# Patient Record
Sex: Female | Born: 1980 | Race: Asian | Hispanic: No | Marital: Married | Smoking: Never smoker
Health system: Southern US, Community
[De-identification: ages and names within clinical notes are randomized; demographics above are authoritative.]

## PROBLEM LIST (undated history)

## (undated) DIAGNOSIS — Z789 Other specified health status: Secondary | ICD-10-CM

## (undated) DIAGNOSIS — I10 Essential (primary) hypertension: Secondary | ICD-10-CM

## (undated) HISTORY — PX: NO PAST SURGERIES: SHX2092

---

## 2010-11-08 NOTE — L&D Delivery Note (Addendum)
Operative Delivery Note At 7:12 PM a viable female was delivered via .  Presentation: vertex; Position: Right,, Occiput,, Posterior; Station: +2.  Verbal consent: obtained from patient with translator.  Risks and benefits discussed in detail.  Risks include, but are not limited to the risks of anesthesia, bleeding, infection, damage to maternal tissues, fetal cephalhematoma.  There is also the risk of inability to effect vaginal delivery of the head, or shoulder dystocia that cannot be resolved by established maneuvers, leading to the need for emergency cesarean section. Attending: Dr. Marice Potter Fellow: Dr. Orvan Falconer Indications: protracted desdent, maternal exhaustion, fetal asynclitism  Procedure in detail: after informed consent obtained a Kiwi vacuum was placed on the infants head inflated to the green.  After one application it popped off and a midline episiotomy was performed.  The vacuum was reapplied and the infants head was delivered from ROP position.  Nuchal cord x3 with body cord.  Cord clamped and cut and infant placed in warmer with awaiting RN/NICU.  APGAR: 1, 8; weight 6 lb 1.5 oz (2765 g).   Nuchal cord x3 with body cord Placenta status: spontaneous and intact with 3VC Cord: 3 vessels with the following complications: .  Cord pH: pending, attempted to obtain but vessels very small  Anesthesia: Epidural Local  Instruments: kiwi vacuum Episiotomy: midline Lacerations: 2nd degree laceration Suture Repair: 3.0 vicryl Est. Blood Loss (mL): Mom to postpartum.  Baby to nursery-stable.  Valerie Monroe 06/21/2011, 7:51 PM

## 2011-02-24 ENCOUNTER — Other Ambulatory Visit: Payer: Self-pay | Admitting: Family Medicine

## 2011-02-24 DIAGNOSIS — Z3689 Encounter for other specified antenatal screening: Secondary | ICD-10-CM

## 2011-02-24 LAB — ANTIBODY SCREEN: Antibody Screen: NEGATIVE

## 2011-02-24 LAB — RPR: RPR: NONREACTIVE

## 2011-02-24 LAB — ABO/RH

## 2011-03-01 ENCOUNTER — Encounter (HOSPITAL_COMMUNITY): Payer: Self-pay

## 2011-03-01 ENCOUNTER — Ambulatory Visit (HOSPITAL_COMMUNITY)
Admission: RE | Admit: 2011-03-01 | Discharge: 2011-03-01 | Disposition: A | Discharge status: Home / Self Care | Payer: Medicaid Other | Source: Ambulatory Visit | Attending: Family Medicine | Admitting: Family Medicine

## 2011-03-01 DIAGNOSIS — Z3689 Encounter for other specified antenatal screening: Secondary | ICD-10-CM

## 2011-03-01 DIAGNOSIS — Z1389 Encounter for screening for other disorder: Secondary | ICD-10-CM | POA: Insufficient documentation

## 2011-03-01 DIAGNOSIS — Z363 Encounter for antenatal screening for malformations: Secondary | ICD-10-CM | POA: Insufficient documentation

## 2011-03-01 DIAGNOSIS — O358XX0 Maternal care for other (suspected) fetal abnormality and damage, not applicable or unspecified: Secondary | ICD-10-CM | POA: Insufficient documentation

## 2011-04-09 ENCOUNTER — Inpatient Hospital Stay (HOSPITAL_COMMUNITY)
Admission: AD | Admit: 2011-04-09 | Payer: Medicaid Other | Source: Ambulatory Visit | Admitting: Obstetrics & Gynecology

## 2011-06-20 ENCOUNTER — Inpatient Hospital Stay (HOSPITAL_COMMUNITY)
Admission: AD | Admit: 2011-06-20 | Discharge: 2011-06-23 | DRG: 774 | Disposition: A | Discharge status: Home / Self Care | Payer: Medicaid Other | Source: Ambulatory Visit | Attending: Obstetrics & Gynecology | Admitting: Obstetrics & Gynecology

## 2011-06-20 DIAGNOSIS — IMO0002 Reserved for concepts with insufficient information to code with codable children: Principal | ICD-10-CM | POA: Diagnosis present

## 2011-06-20 DIAGNOSIS — O139 Gestational [pregnancy-induced] hypertension without significant proteinuria, unspecified trimester: Secondary | ICD-10-CM

## 2011-06-20 DIAGNOSIS — O480 Post-term pregnancy: Secondary | ICD-10-CM | POA: Diagnosis present

## 2011-06-20 DIAGNOSIS — O324XX0 Maternal care for high head at term, not applicable or unspecified: Secondary | ICD-10-CM | POA: Diagnosis present

## 2011-06-20 DIAGNOSIS — Z3480 Encounter for supervision of other normal pregnancy, unspecified trimester: Secondary | ICD-10-CM

## 2011-06-20 HISTORY — DX: Other specified health status: Z78.9

## 2011-06-21 ENCOUNTER — Encounter (HOSPITAL_COMMUNITY): Payer: Self-pay | Admitting: *Deleted

## 2011-06-21 ENCOUNTER — Inpatient Hospital Stay (HOSPITAL_COMMUNITY): Payer: Medicaid Other | Admitting: Anesthesiology

## 2011-06-21 ENCOUNTER — Other Ambulatory Visit: Payer: Self-pay | Admitting: Obstetrics & Gynecology

## 2011-06-21 ENCOUNTER — Encounter (HOSPITAL_COMMUNITY): Payer: Self-pay | Admitting: Anesthesiology

## 2011-06-21 DIAGNOSIS — O324XX0 Maternal care for high head at term, not applicable or unspecified: Secondary | ICD-10-CM

## 2011-06-21 DIAGNOSIS — IMO0002 Reserved for concepts with insufficient information to code with codable children: Secondary | ICD-10-CM

## 2011-06-21 LAB — CBC
HCT: 39.9 % (ref 36.0–46.0)
MCH: 31.5 pg (ref 26.0–34.0)
MCHC: 33.8 g/dL (ref 30.0–36.0)
MCV: 93.2 fL (ref 78.0–100.0)
Platelets: 196 10*3/uL (ref 150–400)
Platelets: 192 10*3/uL (ref 150–400)
RBC: 4.65 MIL/uL (ref 3.87–5.11)
RDW: 13.2 % (ref 11.5–15.5)
WBC: 19.6 10*3/uL — ABNORMAL HIGH (ref 4.0–10.5)

## 2011-06-21 LAB — COMPREHENSIVE METABOLIC PANEL
Albumin: 2.6 g/dL — ABNORMAL LOW (ref 3.5–5.2)
Alkaline Phosphatase: 232 U/L — ABNORMAL HIGH (ref 39–117)
BUN: 13 mg/dL (ref 6–23)
Calcium: 8.7 mg/dL (ref 8.4–10.5)
Creatinine, Ser: 0.72 mg/dL (ref 0.50–1.10)
GFR calc Af Amer: >60 mL/min (ref >60)
Glucose, Bld: 92 mg/dL (ref 70–99)
Potassium: 4.1 mEq/L (ref 3.5–5.1)
Total Protein: 6.1 g/dL (ref 6.0–8.3)

## 2011-06-21 LAB — PROTEIN / CREATININE RATIO, URINE
Protein Creatinine Ratio: 0.48 — ABNORMAL HIGH (ref 0.00–0.15)
Total Protein, Urine: 13.4 mg/dL

## 2011-06-21 MED ORDER — PHENYLEPHRINE 40 MCG/ML (10ML) SYRINGE FOR IV PUSH (FOR BLOOD PRESSURE SUPPORT)
80.0000 ug | PREFILLED_SYRINGE | INTRAVENOUS | Status: DC | PRN
  Filled 2011-06-21 (×2): qty 5

## 2011-06-21 MED ORDER — MAGNESIUM SULFATE 40 G IN LACTATED RINGERS - SIMPLE
2.0000 g/h | INTRAVENOUS | Status: DC
  Filled 2011-06-21: qty 500

## 2011-06-21 MED ORDER — BENZOCAINE-MENTHOL 20-0.5 % EX AERO
1.0000 "application " | INHALATION_SPRAY | CUTANEOUS | Status: DC | PRN
  Administered 2011-06-22: 1 via TOPICAL
  Filled 2011-06-21: qty 56

## 2011-06-21 MED ORDER — LIDOCAINE HCL 1.5 % IJ SOLN
INTRAMUSCULAR | Status: DC | PRN
  Administered 2011-06-21 (×2): 5 mL via EPIDURAL

## 2011-06-21 MED ORDER — MISOPROSTOL 200 MCG PO TABS
ORAL_TABLET | ORAL | Status: AC
  Administered 2011-06-21: 1000 ug via RECTAL
  Filled 2011-06-21: qty 5

## 2011-06-21 MED ORDER — LIDOCAINE HCL (PF) 1 % IJ SOLN
30.0000 mL | INTRAMUSCULAR | Status: DC | PRN
  Filled 2011-06-21 (×2): qty 30

## 2011-06-21 MED ORDER — FENTANYL 2.5 MCG/ML BUPIVACAINE 1/10 % EPIDURAL INFUSION (WH - ANES)
INTRAMUSCULAR | Status: DC | PRN
  Administered 2011-06-21: 14 mL/h via EPIDURAL

## 2011-06-21 MED ORDER — WITCH HAZEL-GLYCERIN EX PADS: 1.0000 "application " | MEDICATED_PAD | CUTANEOUS | Status: DC | PRN

## 2011-06-21 MED ORDER — FERROUS SULFATE 325 (65 FE) MG PO TABS
325.0000 mg | ORAL_TABLET | Freq: Two times a day (BID) | ORAL | Status: DC
  Administered 2011-06-22 – 2011-06-23 (×4): 325 mg via ORAL
  Filled 2011-06-21 (×3): qty 1

## 2011-06-21 MED ORDER — OXYTOCIN BOLUS FROM INFUSION
500.0000 mL | Freq: Once | INTRAVENOUS | Status: DC
  Filled 2011-06-21: qty 500
  Filled 2011-06-21: qty 1000

## 2011-06-21 MED ORDER — SIMETHICONE 80 MG PO CHEW: 80.0000 mg | CHEWABLE_TABLET | ORAL | Status: DC | PRN

## 2011-06-21 MED ORDER — DIPHENHYDRAMINE HCL 50 MG/ML IJ SOLN: 12.5000 mg | INTRAMUSCULAR | Status: DC | PRN

## 2011-06-21 MED ORDER — IBUPROFEN 600 MG PO TABS
600.0000 mg | ORAL_TABLET | Freq: Four times a day (QID) | ORAL | Status: DC
  Administered 2011-06-22 – 2011-06-23 (×8): 600 mg via ORAL
  Filled 2011-06-21 (×8): qty 1

## 2011-06-21 MED ORDER — SENNOSIDES-DOCUSATE SODIUM 8.6-50 MG PO TABS
2.0000 | ORAL_TABLET | Freq: Every day | ORAL | Status: DC
  Administered 2011-06-22 (×2): 2 via ORAL

## 2011-06-21 MED ORDER — MAGNESIUM SULFATE 40 G IN LACTATED RINGERS - SIMPLE
2.0000 g/h | INTRAVENOUS | Status: AC
  Administered 2011-06-22: 2 g/h via INTRAVENOUS
  Filled 2011-06-21: qty 500

## 2011-06-21 MED ORDER — OXYTOCIN 20 UNITS IN LACTATED RINGERS INFUSION - SIMPLE: 125.0000 mL/h | INTRAVENOUS | Status: DC

## 2011-06-21 MED ORDER — OXYTOCIN 20 UNITS IN LACTATED RINGERS INFUSION - SIMPLE
1.0000 m[IU]/min | INTRAVENOUS | Status: DC
  Administered 2011-06-21: 12 m[IU]/min via INTRAVENOUS
  Administered 2011-06-21: 33.333 m[IU]/min via INTRAVENOUS
  Administered 2011-06-21: 2 m[IU]/min via INTRAVENOUS
  Administered 2011-06-21: 333 m[IU]/min via INTRAVENOUS
  Filled 2011-06-21: qty 1000

## 2011-06-21 MED ORDER — MAGNESIUM SULFATE BOLUS VIA INFUSION
4.0000 g | Freq: Once | INTRAVENOUS | Status: DC
  Filled 2011-06-21: qty 500

## 2011-06-21 MED ORDER — IBUPROFEN 600 MG PO TABS: 600.0000 mg | ORAL_TABLET | Freq: Four times a day (QID) | ORAL | Status: DC | PRN

## 2011-06-21 MED ORDER — OXYCODONE-ACETAMINOPHEN 5-325 MG PO TABS: 2.0000 | ORAL_TABLET | ORAL | Status: DC | PRN

## 2011-06-21 MED ORDER — LACTATED RINGERS IV SOLN
INTRAVENOUS | Status: DC
  Administered 2011-06-21: 25 mL/h via INTRAVENOUS

## 2011-06-21 MED ORDER — TERBUTALINE SULFATE 1 MG/ML IJ SOLN: 0.2500 mg | Freq: Once | INTRAMUSCULAR | Status: DC | PRN

## 2011-06-21 MED ORDER — LANOLIN HYDROUS EX OINT: TOPICAL_OINTMENT | CUTANEOUS | Status: DC | PRN

## 2011-06-21 MED ORDER — ONDANSETRON HCL 4 MG/2ML IJ SOLN: 4.0000 mg | INTRAMUSCULAR | Status: DC | PRN

## 2011-06-21 MED ORDER — DIPHENHYDRAMINE HCL 25 MG PO CAPS: 25.0000 mg | ORAL_CAPSULE | Freq: Four times a day (QID) | ORAL | Status: DC | PRN

## 2011-06-21 MED ORDER — LACTATED RINGERS IV SOLN
INTRAVENOUS | Status: DC
  Administered 2011-06-22 (×2): via INTRAVENOUS

## 2011-06-21 MED ORDER — FENTANYL 2.5 MCG/ML BUPIVACAINE 1/10 % EPIDURAL INFUSION (WH - ANES)
14.0000 mL/h | INTRAMUSCULAR | Status: DC
  Administered 2011-06-21 (×2): 14 mL/h via EPIDURAL
  Filled 2011-06-21 (×3): qty 60

## 2011-06-21 MED ORDER — MAGNESIUM SULFATE BOLUS VIA INFUSION
4.0000 g | Freq: Once | INTRAVENOUS | Status: AC
  Administered 2011-06-21: 4 g via INTRAVENOUS

## 2011-06-21 MED ORDER — FLEET ENEMA 7-19 GM/118ML RE ENEM: 1.0000 | ENEMA | RECTAL | Status: DC | PRN

## 2011-06-21 MED ORDER — LACTATED RINGERS IV SOLN: 500.0000 mL | INTRAVENOUS | Status: DC | PRN

## 2011-06-21 MED ORDER — ONDANSETRON HCL 4 MG PO TABS: 4.0000 mg | ORAL_TABLET | ORAL | Status: DC | PRN

## 2011-06-21 MED ORDER — ONDANSETRON HCL 4 MG/2ML IJ SOLN: 4.0000 mg | Freq: Four times a day (QID) | INTRAMUSCULAR | Status: DC | PRN

## 2011-06-21 MED ORDER — MAGNESIUM SULFATE 40 G IN LACTATED RINGERS - SIMPLE: 2.0000 g/h | INTRAVENOUS | Status: DC

## 2011-06-21 MED ORDER — EPHEDRINE 5 MG/ML INJ
10.0000 mg | INTRAVENOUS | Status: DC | PRN
  Filled 2011-06-21 (×2): qty 4

## 2011-06-21 MED ORDER — TETANUS-DIPHTH-ACELL PERTUSSIS 5-2.5-18.5 LF-MCG/0.5 IM SUSP
0.5000 mL | Freq: Once | INTRAMUSCULAR | Status: AC
  Administered 2011-06-22: 0.5 mL via INTRAMUSCULAR
  Filled 2011-06-21: qty 0.5

## 2011-06-21 MED ORDER — MAGNESIUM SULFATE BOLUS VIA INFUSION: 4.0000 g | Freq: Once | INTRAVENOUS | Status: DC

## 2011-06-21 MED ORDER — LACTATED RINGERS IV SOLN
500.0000 mL | Freq: Once | INTRAVENOUS | Status: AC
  Administered 2011-06-21: 1000 mL via INTRAVENOUS

## 2011-06-21 MED ORDER — EPHEDRINE 5 MG/ML INJ
10.0000 mg | INTRAVENOUS | Status: DC | PRN
  Filled 2011-06-21: qty 4

## 2011-06-21 MED ORDER — DIBUCAINE 1 % RE OINT: 1.0000 "application " | TOPICAL_OINTMENT | RECTAL | Status: DC | PRN

## 2011-06-21 MED ORDER — PHENYLEPHRINE 40 MCG/ML (10ML) SYRINGE FOR IV PUSH (FOR BLOOD PRESSURE SUPPORT)
80.0000 ug | PREFILLED_SYRINGE | INTRAVENOUS | Status: DC | PRN
  Filled 2011-06-21: qty 5

## 2011-06-21 MED ORDER — MAGNESIUM SULFATE 40 G IN LACTATED RINGERS - SIMPLE
2.0000 g/h | INTRAVENOUS | Status: DC
  Administered 2011-06-21 (×2): 2 g/h via INTRAVENOUS
  Filled 2011-06-21: qty 500

## 2011-06-21 MED ORDER — CITRIC ACID-SODIUM CITRATE 334-500 MG/5ML PO SOLN: 30.0000 mL | ORAL | Status: DC | PRN

## 2011-06-21 MED ORDER — OXYCODONE-ACETAMINOPHEN 5-325 MG PO TABS: 1.0000 | ORAL_TABLET | ORAL | Status: DC | PRN

## 2011-06-21 MED ORDER — DEXTROSE 5 % IV SOLN
4.0000 g | Freq: Once | INTRAVENOUS | Status: DC
  Filled 2011-06-21: qty 8

## 2011-06-21 MED ORDER — PRENATAL PLUS 27-1 MG PO TABS
1.0000 | ORAL_TABLET | Freq: Every day | ORAL | Status: DC
  Administered 2011-06-22 – 2011-06-23 (×2): 1 via ORAL
  Filled 2011-06-21 (×2): qty 1

## 2011-06-21 MED ORDER — ACETAMINOPHEN 325 MG PO TABS: 650.0000 mg | ORAL_TABLET | ORAL | Status: DC | PRN

## 2011-06-21 NOTE — Progress Notes (Signed)
Naseem Adler is a 30 y.o. G1P0000 at [redacted]w[redacted]d admitted for induction of labor due to Pre-eclamptic toxemia of pregnancy..  Subjective: Pushing for 1 hour  Objective: BP 175/112  Pulse 122  Temp(Src) 98.1 F (36.7 C) (Axillary)  Resp 20  Ht 5' (1.524 m)  Wt 62.596 kg (138 lb)  BMI 26.95 kg/m2  LMP 10/16/2010 I/O last 3 completed shifts: In: 164.6 [I.V.:164.6] Out: 800 [Urine:800] I/O this shift: In: 1165 [P.O.:390; I.V.:775] Out: 2600 [Urine:2600]  FHT:  FHR: 110 bpm, variability: moderate,  accelerations:  Abscent,  decelerations:  Present early UC:   regular, every 2-3 minutes SVE:   Dilation: 10 Effacement (%): 100 Station: +2 Exam by:: dr. Louanne Belton  Labs: Lab Results  Component Value Date   WBC 19.6* 06/21/2011   HGB 14.8 06/21/2011   HCT 43.0 06/21/2011   MCV 92.5 06/21/2011   PLT 192 06/21/2011    Assessment / Plan: IOL s/p foley bulb for postdates and preE  Labor: progressing well.  continue to push.  appears OA by exam but does have some caput Fetal Wellbeing:  Category II Pain Control:  Epidural I/D:  n/a Anticipated MOD:  NSVD  Ramzy Cappelletti 06/21/2011, 5:31 PM

## 2011-06-21 NOTE — Progress Notes (Signed)
Valerie Monroe is a 30 y.o. G1P0 at [redacted]w[redacted]d admitted for augmentation of labor 2/2 possible preE  Subjective: Pt resting comfortably.  Objective: BP 143/93  Pulse 90  Temp(Src) 99.2 F (37.3 C) (Oral)  Resp 20  Ht 5' (1.524 m)  Wt 138 lb (62.596 kg)  BMI 26.95 kg/m2  LMP 10/16/2010   I/O this shift: In: 64.6 [I.V.:64.6] Out: 300 [Urine:300]  FHT:  FHR: 130 bpm, variability: moderate,  accelerations:  Present,  decelerations:  Absent UC:   irregular, every 5-9 minutes SVE:   Dilation: 2 Effacement (%): 50 Station: -2 Exam by:: Dr Maple Hudson rechecked by RN at 0400, unchanged Lungs: ctab Neuro: no clonus, brisk DTRs Extremities: trace edema  Labs: Lab Results  Component Value Date   WBC 15.8* 06/21/2011   HGB 13.5 06/21/2011   HCT 39.9 06/21/2011   MCV 93.2 06/21/2011   PLT 196 06/21/2011    Assessment / Plan: 30yo G1 @ 40+1wks with gHTN, possible PreE  Labor: On pitocin Fetal Wellbeing:  Category I Pain Control:  Labor support without medications I/D:  GBS neg Anticipated MOD:  NSVD Preeclampsia - AST and plt wnl.  Urine protein/creatinine pending, BP more stable, on Mag. No signs/sx of mag toxicity  Lindaann Slough. MD 06/21/2011, 5:11 AM

## 2011-06-21 NOTE — Consult Note (Signed)
Called to maternal room 168 to attend impending assisted vaginal delivery at 40.[redacted] weeks gestation with h/o preeclampsia on magnesium sulfate.  At delivery after an extended period of vacuum suction infant's head was delivered with tight nuchal cord x 3 and body cord x 1.  With the completion of delivery, infant was limp apneic and moribund. Placed under radiant warmer, laryngoscopy prepared but required suction of oropharynx several times to obtain a clear view of the cords. Infant made no response to these events.  Laryngoscopy @ 1 minutes with suction  yielded no fluid and immediately infant was dried and bag/mask ventilation begun with supplemental oxygen.  Infant had a spontaneous cry at 2 minutes by which time the HR, previously ~ 40/min, was then > 100 .  With continued IPPV, tone rapidly improved to involve all four extremities with active flexion and extension .   IPPV stopped at 2 minutes of age and supplemental O2 continued until 3+ minutes of age. There were no dysmorphic features noted on exam. Umbilical cord has little Wharton's Jelly.   Infant allowed to remain for a short time in maternal room.   Judith Blonder MD [facility]

## 2011-06-21 NOTE — Progress Notes (Signed)
Valerie Monroe is a 30 y.o. G1P0000 at [redacted]w[redacted]d   Subjective: Pt resting comfortably s/p epidural  Objective: BP 156/97  Pulse 110  Temp(Src) 98.1 F (36.7 C) (Oral)  Resp 20  Ht 5' (1.524 m)  Wt 138 lb (62.596 kg)  BMI 26.95 kg/m2  LMP 10/16/2010 I/O last 3 completed shifts: In: 164.6 [I.V.:164.6] Out: 800 [Urine:800] I/O this shift: In: 850 [P.O.:150; I.V.:700] Out: 2000 [Urine:2000] Mag at 2g/hr FHT:  FHR: 110-120 bpm, variability: moderate,  accelerations:  Present,  decelerations:  Present early UC:   regular, every 2-3 minutes Pitocin @ 11/min SVE:  Dilation: 10 Dilation Complete Date: 06/21/11 Dilation Complete Time: 1556 Effacement (%): 100 Cervical Position: Anterior Station: +2 Presentation: Vertex Exam by:: dr. Louanne Belton   Labs: Lab Results  Component Value Date   WBC 19.6* 06/21/2011   HGB 14.8 06/21/2011   HCT 43.0 06/21/2011   MCV 92.5 06/21/2011   PLT 192 06/21/2011    Assessment / Plan: Induction of labor due to preeclampsia and postterm,  progressing well on pitocin  Labor: progressing well on pitocin, will begin pushing Fetal Wellbeing:  Category II Pain Control:  Epidural I/D:  n/a Anticipated MOD:  NSVD  Valerie Monroe 06/21/2011, 4:07 PM

## 2011-06-21 NOTE — Anesthesia Procedure Notes (Signed)
Epidural Patient location during procedure: OB Start time: 06/21/2011 10:16 AM End time: 06/21/2011 10:38 AM  Staffing Anesthesiologist: Sandrea Hughs Performed by: anesthesiologist   Preanesthetic Checklist Completed: patient identified, site marked, surgical consent, pre-op evaluation, timeout performed, IV checked, risks and benefits discussed and monitors and equipment checked  Epidural Patient position: sitting Prep: site prepped and draped Patient monitoring: continuous pulse ox and blood pressure Approach: midline Injection technique: LOR air  Needle:  Needle type: Tuohy  Needle gauge: 17 G Needle length: 9 cm Needle insertion depth: 5 cm cm Catheter type: closed end flexible Catheter size: 19 Gauge Catheter at skin depth: 10 cm Test dose: negative and 1.5% lidocaine  Assessment Sensory level: T10 Events: blood not aspirated, injection not painful, no injection resistance, negative IV test and no paresthesia

## 2011-06-21 NOTE — Anesthesia Preprocedure Evaluation (Signed)
Anesthesia Evaluation  Name, MR# and DOB Patient awake  General Assessment Comment  Reviewed: Allergy & Precautions, H&P , Patient's Chart, lab work & pertinent test results  Airway Mallampati: II TM Distance: >3 FB Neck ROM: full    Dental No notable dental hx.    Pulmonary  clear to auscultation  pulmonary exam normalPulmonary Exam Normal breath sounds clear to auscultation none    Cardiovascular hypertension (pre-eclampsia), regular Normal    Neuro/Psych Negative Neurological ROS  Negative Psych ROS  GI/Hepatic/Renal negative GI ROS, negative Liver ROS, and negative Renal ROS (+)       Endo/Other  Negative Endocrine ROS (+)      Abdominal   Musculoskeletal   Hematology negative hematology ROS (+)   Peds  Reproductive/Obstetrics (+) Pregnancy    Anesthesia Other Findings             Anesthesia Physical Anesthesia Plan  ASA: II  Anesthesia Plan: Epidural   Post-op Pain Management:    Induction:   Airway Management Planned:   Additional Equipment:   Intra-op Plan:   Post-operative Plan:   Informed Consent: I have reviewed the patients History and Physical, chart, labs and discussed the procedure including the risks, benefits and alternatives for the proposed anesthesia with the patient or authorized representative who has indicated his/her understanding and acceptance.     Plan Discussed with:   Anesthesia Plan Comments:         Anesthesia Quick Evaluation

## 2011-06-21 NOTE — Progress Notes (Signed)
Pt G1 at 40wks having contractions x 1 day.  Pt denies any problems with pregnancy.

## 2011-06-21 NOTE — Progress Notes (Signed)
Margree Gimbel is a 30 y.o. G1P0 at [redacted]w[redacted]d admitted for induction of labor due to Hypertension.  Subjective: No acute issues.  Objective: BP 148/87  Pulse 90  Temp(Src) 99.2 F (37.3 C) (Oral)  Resp 20  Ht 5' (1.524 m)  Wt 138 lb (62.596 kg)  BMI 26.95 kg/m2  LMP 10/16/2010  plt 196 AST 16    FHT:  FHR: 125 bpm, variability: moderate,  accelerations:  Present,  decelerations:  Absent UC:   irregular,  SVE:   Dilation: 2 Effacement (%): 50 Station: -2 Exam by:: Dr Maple Hudson  Labs: Lab Results  Component Value Date   WBC 15.8* 06/21/2011   HGB 13.5 06/21/2011   HCT 39.9 06/21/2011   MCV 93.2 06/21/2011   PLT 196 06/21/2011    Assessment / Plan: 30yo G1 @ 40+1wks with gHTN  Labor: will start on pit, platelet and LFT wnl, urine protein:creatinine pending, on Mag Fetal Wellbeing:  Category I Pain Control:  Labor support without medications I/D:  GBS neg Anticipated MOD:  NSVD  Lindaann Slough MD 06/21/2011, 3:04 AM

## 2011-06-21 NOTE — Progress Notes (Signed)
Valerie Monroe is a 29 y.o. G1P0000 at [redacted]w[redacted]d   Subjective: Pt resting comfortably s/p epidural  Objective: BP 141/91  Pulse 90  Temp(Src) 97.5 F (36.4 C) (Oral)  Resp 16  Ht 5' (1.524 m)  Wt 62.596 kg (138 lb)  BMI 26.95 kg/m2  LMP 10/16/2010 I/O last 3 completed shifts: In: 164.6 [I.V.:164.6] Out: 800 [Urine:800] I/O this shift: In: 770 [P.O.:120; I.V.:650] Out: 1450 [Urine:1450] Mag at 2g/hr FHT:  FHR: 110-120 bpm, variability: moderate,  accelerations:  Present,  decelerations:  Present early UC:   regular, every 2-3 minutes Pitocin @ 11/min SVE:  6-7/90/0 IUPC placed  Labs: Lab Results  Component Value Date   WBC 19.6* 06/21/2011   HGB 14.8 06/21/2011   HCT 43.0 06/21/2011   MCV 92.5 06/21/2011   PLT 192 06/21/2011    Assessment / Plan: Induction of labor due to preeclampsia and postterm,  progressing well on pitocin  Labor: progressing well on pitocin, titrate as tolerated Fetal Wellbeing:  Category II Pain Control:  Epidural I/D:  n/a Anticipated MOD:  NSVD  Valerie Monroe 06/21/2011, 1:08 PM

## 2011-06-21 NOTE — Progress Notes (Signed)
Started hurting yesterday worse today.  Thinks she is in labor.  G1p0. 40 wks

## 2011-06-21 NOTE — Progress Notes (Signed)
Maryem Shuffler is a 30 y.o. G1P0 at [redacted]w[redacted]d, admitted for augmentation of labor secondary to ?preE.  Subjective: Feeling ctx, wants epidural.  Foley fell out.  AROM with nurse check after foley fell out.  Thick mec.  Objective: BP 155/73  Pulse 97  Temp(Src) 98.6 F (37 C) (Oral)  Resp 20  Ht 5' (1.524 m)  Wt 138 lb (62.596 kg)  BMI 26.95 kg/m2  LMP 10/16/2010  Fetal Heart Rate: 115 Variability: mod Accelerations: present Decelerations: variables  Contractions: 2-83min  SVE:   Dilation: 6.5 Effacement (%): 100 Station: 0 Exam by:: Enis Slipper, rn  Pitocin: 6 mili-units per min Mag: 2gm per hr   Assessment / Plan: 30 y.o. G1P0 at [redacted]w[redacted]d here for augmentation of labor.  Labor: Progressing well, will continue to monitor Preeclampsia:  On Mag, no signs of toxicity Fetal Wellbeing: Category II Pain Control:  Requesting epidural I/D:  NA  Gillis Boardley 06/21/2011, 9:22 AM

## 2011-06-21 NOTE — Progress Notes (Signed)
Pt cervical exam has had minimal change since admission despite starting pitocin, so decision was made to place foley bulb. SVE prior to placement 1-2/80/-2.  Foley bulb placed with 60mL of saline place in balloon under guidance of Zerita Boers CNM.  Pt tolerated procedure well. Will continue pt on pitocin for augmentation.   Delton See MD

## 2011-06-21 NOTE — Progress Notes (Signed)
Valerie Monroe is a 30 y.o. G1P0 at [redacted]w[redacted]d, admitted for augmentation of labor secondary to ?preE.  Subjective: S/p epidural, resting comfortably  Objective: BP 150/97  Pulse 86  Temp(Src) 98.6 F (37 C) (Oral)  Resp 20  Ht 5' (1.524 m)  Wt 138 lb (62.596 kg)  BMI 26.95 kg/m2  LMP 10/16/2010  Fetal Heart Rate: 115 Variability: mod Accelerations: present Decelerations: variables  Contractions: 2-42min  SVE:   Dilation: 6.5 Effacement (%): 90 Station: 0 Exam by:: Dr. Louanne Belton  Pitocin: 6 mili-units per min Mag: 2gm per hr   Assessment / Plan: 30 y.o. G1P0 at [redacted]w[redacted]d here for augmentation of labor.  Labor: Progressing well, will continue to monitor and titrate pit as necessary Preeclampsia:  On Mag, 32 no signs of toxicity Fetal Wellbeing: Category II Pain Control:  Epidural I/D:  NA  Fady Stamps 06/21/2011, 11:18 AM

## 2011-06-21 NOTE — Progress Notes (Signed)
Transferred to room 374 AICU; report to [place], [place]

## 2011-06-21 NOTE — H&P (Signed)
Girtrude Enslin is a 30 y.o. female G1P0 with IUP at [redacted]w[redacted]d presenting for contractions. Pt states she has been having regular, every 7-10 minutes for one day, associated with scant staining vaginal bleeding, intact, with active.  She desires to condoms.  [facility].  She was late to [facility] was dated by a 24wk scan which showed SGA fetus.  Pregnancy was complicated by BMI 17.6, genital warts, +chlamydia with neg TOC, varicella non-immune, LSIL Pap.  She reports that she has had elevated BPs at her recent appointments.  She currently denies any headaches, blurry vision, chest pain, difficulty breathing, abdominal pain, but does not worsening swelling in her feet.    Past Medical History: Past Medical History  Diagnosis Date  . No pertinent past medical history     Past Surgical History: Past Surgical History  Procedure Date  . No past surgeries     Obstetrical History: OB History    Grav Para Term Preterm Abortions TAB SAB Ect Mult Living   1               Gynecological History: LGSIL Pap intrapartum  Social History: History   Social History  . Marital Status: Married    Spouse Name: N/A    Number of Children: N/A  . Years of Education: N/A   Social History Main Topics  . Smoking status: Never Smoker   . Smokeless tobacco: None  . Alcohol Use: No  . Drug Use: No  . Sexually Active:    Other Topics Concern  . None   Social History Narrative  . None    Family History: No family history on file.  Allergies: No Known Allergies  Prescriptions prior to admission  Medication Sig Dispense Refill  . prenatal vitamin w/FE, FA (PRENATAL 1 + 1) 27-1 MG TABS Take 1 tablet by mouth daily.          Review of Systems - Negative except for above in HPI   Blood pressure 151/102, pulse 86, temperature 98.8 F (37.1 C), temperature source Oral, resp. rate 20, height 5' (1.524 m), weight 138 lb (62.596 kg), last menstrual period  10/16/2010. General appearance: alert, cooperative and no distress Head: Normocephalic, without obvious abnormality, atraumatic Lungs: clear to auscultation bilaterally Heart: regular rate and rhythm, S1, S2 normal, no murmur, click, rub or gallop Abdomen: soft, non-tender; bowel sounds normal; no masses,  no organomegaly and gravid Extremities: edema trace pedal Pulses: 2+ and symmetric cephalic Baseline: 140 bpm, Variability: Good {> 6 bpm) and Accelerations: Reactive Frequency: Every 4-6 minutes Dilation: 2 Effacement (%): 50 Station: -2 Exam by:: Dr Maple Hudson   Prenatal labs: ABO, Rh:  B+ Antibody: Negative (04/18 0000) Rubella:  immune RPR: Nonreactive (04/18 0000)  HBsAg: Negative (04/18 0000)  HIV:   neg GBS: Negative (07/17 0000)  1 hr Glucola 74 Genetic screening declined Anatomy US - showing short extremities, BPP 8/8, normal AFI   Assessment: Anvita Hirata is a 30 y.o. G1P0 with an IUP at [redacted]w[redacted]d presenting for  Contractions, elevated BP possible pre-eclampsia  Plan: Will admit patient to L+D and start on IV magnesium.  Will also start pitocin for augmentation.  Will check CBC, CMP, urine Protein:Creatine. Patient plans on going natural, will bottle feed, and will use condoms for contraception.  I have discussed this patient with Zerita Boers, CNM who is in agreement with this plan   Lindaann Slough MD 06/21/2011, 1:05 AM

## 2011-06-21 NOTE — Anesthesia Postprocedure Evaluation (Signed)
Anesthesia Post Note  Patient: Valerie Monroe  Procedure(s) Performed: * No procedures listed *  Anesthesia type: Epidural  Patient location: Mother/Baby  Post pain: Pain level controlled  Post assessment: Post-op Vital signs reviewed  Last Vitals:  Filed Vitals:   06/21/11 2031  BP: 150/93  Pulse: 122  Temp:   Resp: 20    Post vital signs: Reviewed  Level of consciousness: awake  Complications: No apparent anesthesia complications

## 2011-06-22 ENCOUNTER — Encounter (HOSPITAL_COMMUNITY): Payer: Self-pay | Admitting: *Deleted

## 2011-06-22 LAB — CBC
MCH: 31.7 pg (ref 26.0–34.0)
MCV: 91.5 fL (ref 78.0–100.0)
Platelets: 171 10*3/uL (ref 150–400)
RDW: 13.4 % (ref 11.5–15.5)

## 2011-06-22 MED ORDER — SODIUM CHLORIDE 0.9 % IJ SOLN
3.0000 mL | Freq: Two times a day (BID) | INTRAMUSCULAR | Status: DC
  Administered 2011-06-22 – 2011-06-23 (×2): 3 mL via INTRAVENOUS

## 2011-06-22 NOTE — Addendum Note (Signed)
Addendum  created 06/22/11 0826 by Cephus Shelling   Modules edited:Charges VN, Notes Section

## 2011-06-22 NOTE — Progress Notes (Signed)
Post Partum Day 1 Subjective: no complaints and has good UOP but has not voided since foley taken out this AM.  Objective: Blood pressure 123/84, pulse 90, temperature 98.2 F (36.8 C), temperature source Oral, resp. rate 16, height 5' (1.524 m), weight 129 lb 4.8 oz (58.65 kg), last menstrual period 10/16/2010, SpO2 99.00%, unknown if currently breastfeeding.  Physical Exam:  General: alert, cooperative and no distress CV: RRR Resp: CTABL Abd: decreased BS, uterus at level of umbilicus Lochia: appropriate Uterine Fundus: firm Incision: no incision DVT Evaluation: No evidence of DVT seen on physical exam. Ext: trace b/l edema, 2+ pulses   Basename 06/22/11 0505 06/21/11 0930  HGB 9.3* 14.8  HCT 26.8* 43.0    Assessment/Plan: PPD1 s/p vavd now on mag secondary to pre-e.  Doing well currently with well controlled bp.  Diuresing well.  Will cont Mag for total of 24 hours then transfer to floor.   LOS: 2 days   Ryanne Morand 06/22/2011, 7:52 AM

## 2011-06-22 NOTE — Addendum Note (Signed)
Addendum  created 06/22/11 0825 by Cephus Shelling   Modules edited:Charges VN, Notes Section

## 2011-06-22 NOTE — Progress Notes (Signed)
[place] Interpreter [phone] used to discuss plan of care with patient.  Reviewed:  meals - including that pt can have food from home - breakfast ordered from hospital menu medications - including explanation of TDaP vaccine, pt states she wants vaccine pain assessment help from nurse to BR while on MgSO4, pt verbalized understanding anticipated stop time for MgSO4 & transfer to [facility] Pt had no questions at this time

## 2011-06-22 NOTE — Progress Notes (Signed)
UR chart review completed.  

## 2011-06-22 NOTE — Anesthesia Postprocedure Evaluation (Signed)
  Anesthesia Post-op Note  Patient: Valerie Monroe  Procedure(s) Performed: * No procedures listed *  Patient Location: PACU and A-ICU  Anesthesia Type: Epidural  Level of Consciousness: awake, alert  and oriented  Airway and Oxygen Therapy: Patient Spontanous Breathing   Post-op Assessment: Post-op Vital signs reviewed and Patient's Cardiovascular Status Stable  Post-op Vital Signs: Reviewed and stable  Complications: No apparent anesthesia complications

## 2011-06-22 NOTE — Progress Notes (Signed)
Post Partum Day 1 Subjective: no complaints, up ad lib, voiding, tolerating PO and currently breast feeding.   Objective: Blood pressure 144/90, pulse 77, temperature 97.5 F (36.4 C), temperature source Oral, resp. rate 18, height 5' (1.524 m), weight 61.689 kg (136 lb), unknown if currently breastfeeding.  Physical Exam:  General: alert, cooperative and no distress Lochia: appropriate Uterine Fundus: firm, below umbilicus Abd: Soft, NT, +BS Cardio: RRR, no murmurs Lungs: CTA, no wheezes or crackles DTR: +2 DVT Evaluation: No evidence of DVT seen on physical exam. Negative Homan's sign. No cords or calf tenderness.   Basename 06/22/11 0500 06/21/11 2235  HGB 11.7* 13.4  HCT 35.1* 40.2    Assessment/Plan: Breastfeeding and Contraception Condoms. Continue care, consider discontinuing Mag approx 24hrs post partum   LOS: 1 day   Katie Durland 06/22/2011, 7:34 AM   Addendum - I have seen and assess the patient and agree with the above plan.  No signs or symptoms of Mag tox.  BPs stable Delton See MD

## 2011-06-23 MED ORDER — OXYCODONE-ACETAMINOPHEN 5-325 MG PO TABS: 1.0000 | ORAL_TABLET | ORAL | Status: AC | PRN

## 2011-06-23 MED ORDER — IBUPROFEN 600 MG PO TABS: 600.0000 mg | ORAL_TABLET | Freq: Four times a day (QID) | ORAL | Status: AC

## 2011-06-23 MED ORDER — FERROUS SULFATE 325 (65 FE) MG PO TABS: 325.0000 mg | ORAL_TABLET | Freq: Two times a day (BID) | ORAL | Status: DC

## 2011-06-23 MED ORDER — DOCUSATE SODIUM 100 MG PO CAPS: 100.0000 mg | ORAL_CAPSULE | Freq: Two times a day (BID) | ORAL | Status: AC

## 2011-06-23 NOTE — Progress Notes (Signed)
Post Partum Day 2 VAVD with PreE Subjective: No complaints, up ad lib, voiding and tolerating PO, has had BM with no issues. No pain, headaches or difficulty with vision.   Objective: Blood pressure 135/85, pulse 78, temperature 97.8 F (36.6 C), temperature source Oral, resp. rate 16, height 5' (1.524 m), weight 57.3 kg (126 lb 5.2 oz), last menstrual period 10/16/2010, SpO2 98.00%, unknown if currently breastfeeding.  Physical Exam:  General: alert, cooperative and no distress Lochia: appropriate Abd: Soft, NT Uterine Fundus: firm, below umbilicus Cardio: RRR, no murmurs Resp: Clear, no wheezes or rales DTR: +2 DVT Evaluation: No evidence of DVT seen on physical exam. Negative Homan's sign. No cords or calf tenderness.   Basename 06/22/11 0505 06/21/11 0930  HGB 9.3* 14.8  HCT 26.8* 43.0    Assessment/Plan: Breastfeeding and Contraception Condoms.  Transfer to floor and consider discharge later tonight as long as BP continues to be well controlled.   LOS: 3 days   Katie Durland 06/23/2011, 7:21 AM

## 2011-06-23 NOTE — Progress Notes (Signed)
Magnesium gtt d/c'd on 8/14 @ 2200 Verbal report given to [place], Charity fundraiser on [employer]

## 2011-06-23 NOTE — Progress Notes (Signed)
Discharge instructions given to patient and husband via [place] interpreter [phone]  and RN. Patient and husband express understanding

## 2011-06-23 NOTE — Discharge Summary (Signed)
  Obstetric Discharge Summary Reason for Admission: onset of labor and preeclampsia. Prenatal Procedures: ultrasound Intrapartum Procedures: Pt started on magnesium on admission to L&D. Foley bulb placed, then augmented with pitocin. Pt was AROMed and received epidural. Vacuum assisted vaginal delivery (Kiwi) was performed due to protracted descent, maternal exhaustion and fetal asynclitism. Midline episiotomy was performed.  Postpartum Procedures: pt on magnesium 24hr postpartum.  Complications-Operative and Postpartum: 2 degree perineal laceration and midline episiotomy repaired with 3.0 vicryl. BP stable at 127/81 on discharge Hemoglobin  Date Value Range Status  06/22/2011 9.3* 12.0-15.0 (g/dL) Final     DELTA CHECK NOTED     REPEATED TO VERIFY     HCT  Date Value Range Status  06/22/2011 26.8* 36.0-46.0 (%) Final  urine pr/cr: 0.48  Discharge Diagnoses: Term pregnancy delivered  Discharge Information: Date: 06/23/2011 Activity: pelvic rest Diet: routine Medications: Ibuprophen, Colace, Iron and Percocet Condition: stable Instructions: refer to practice specific booklet Discharge to: home   Newborn Data: Live born female, nuchal cord x 3 with body cord Birth Weight: 6 lb 1.5 oz (2764 g) APGAR: 1, 8  Home with mother.  Valerie Monroe 06/23/2011, 4:45 PM

## 2011-06-24 NOTE — Discharge Summary (Signed)
Agree with above note.  LEGGETT,KELLY H. 06/24/2011 3:43 PM

## 2012-08-11 IMAGING — US US OB DETAIL+14 WK
1 series · 12 of 28 positions shown · non-contrast
Comparison: none

[Series 1: us ob detail +14 wk · 81 acquisitions, 12 frames shown]
[im 3/81]
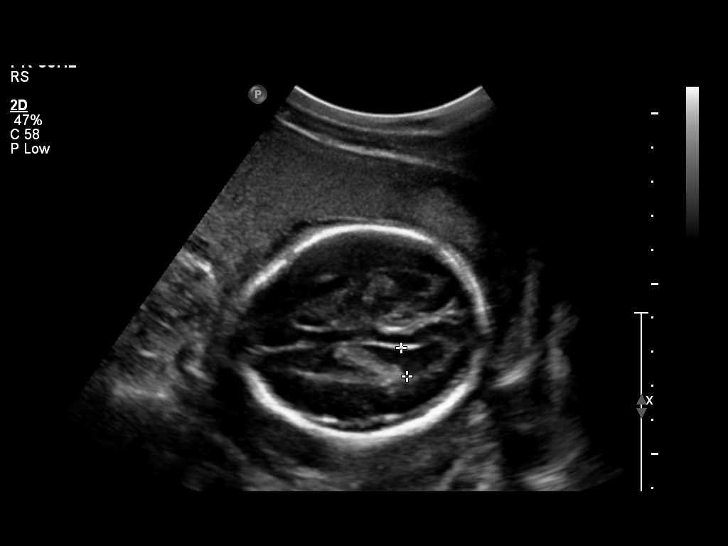
[im 9/81]
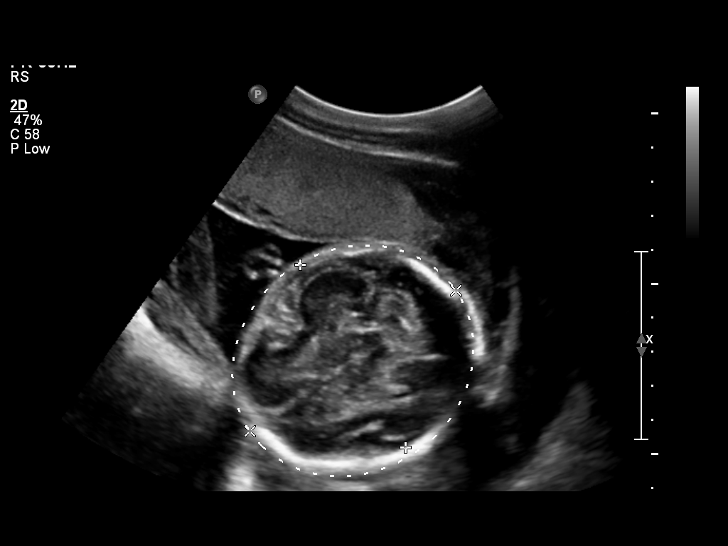
[im 15/81]
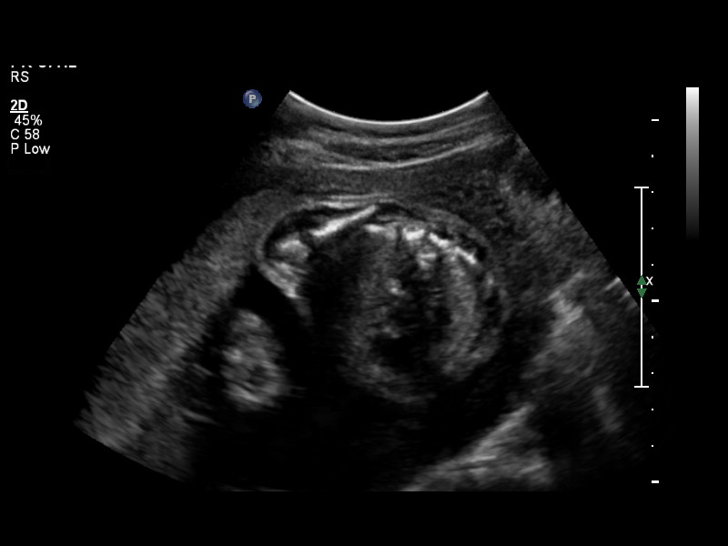
[im 24/81]
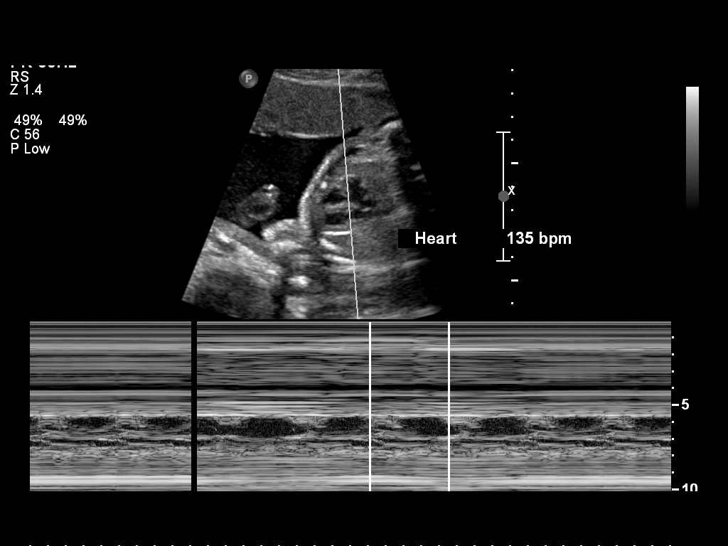
[im 30/81]
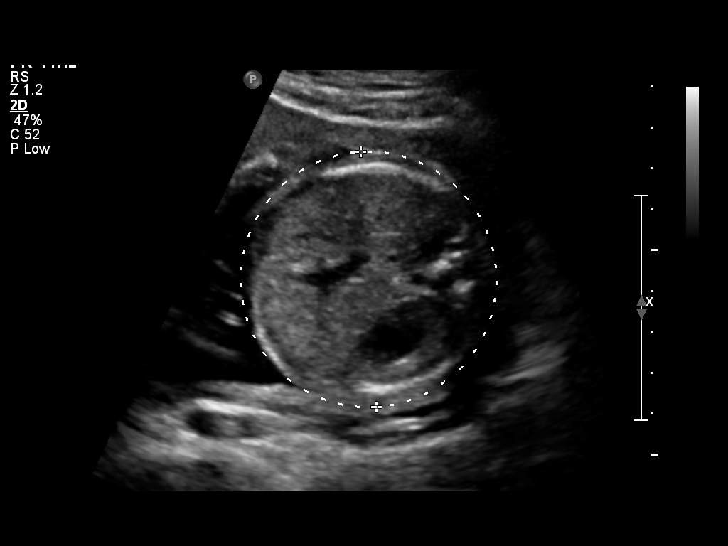
[im 36/81]
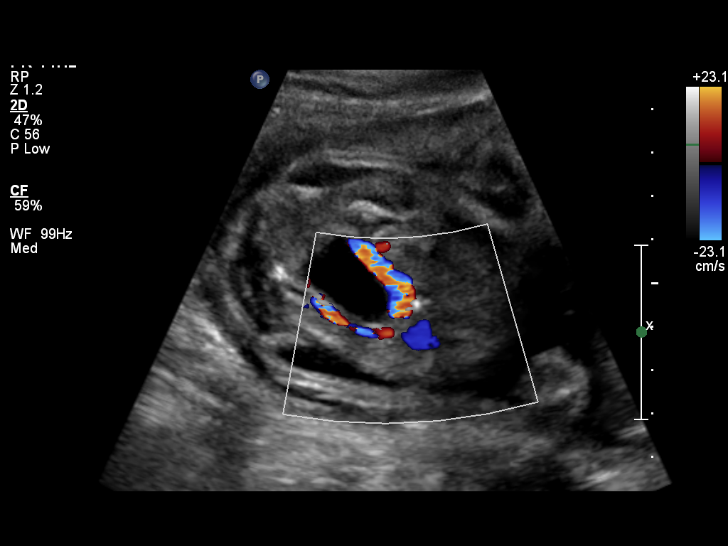
[im 45/81]
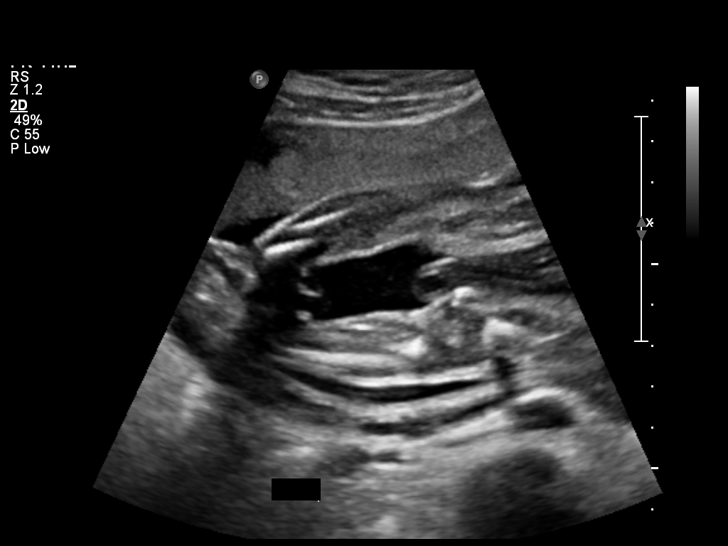
[im 51/81]
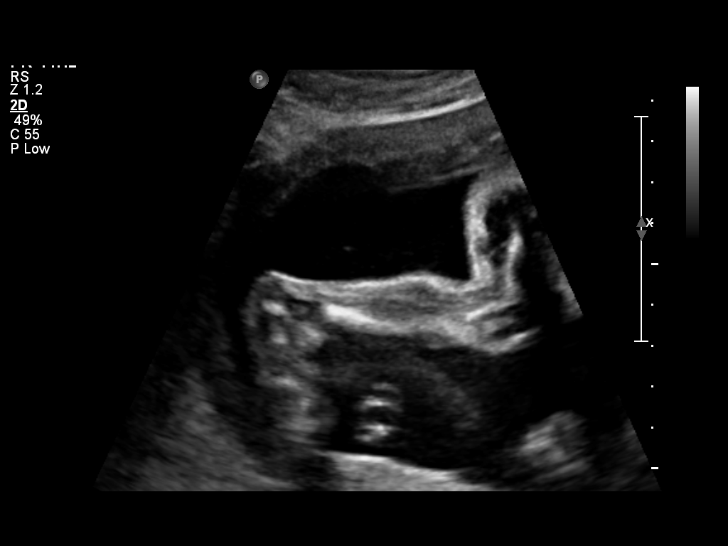
[im 57/81]
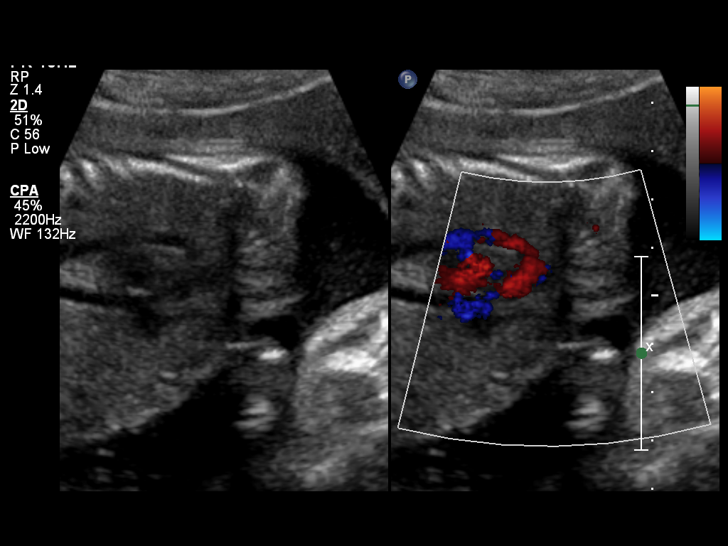
[im 66/81]
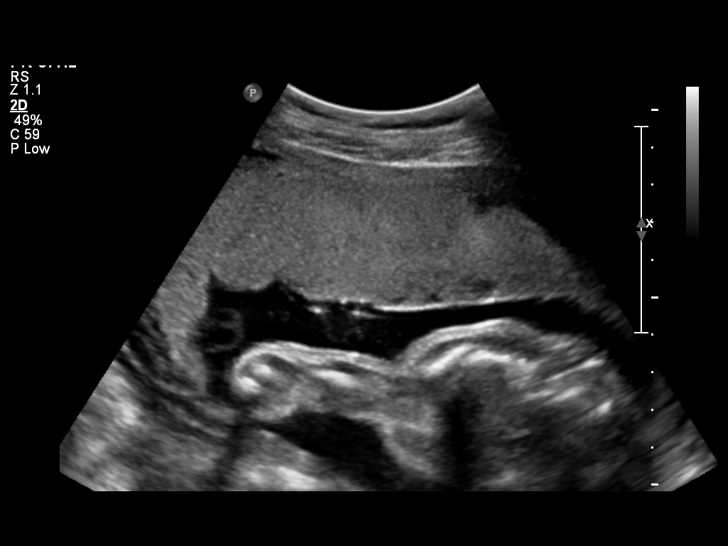
[im 72/81]
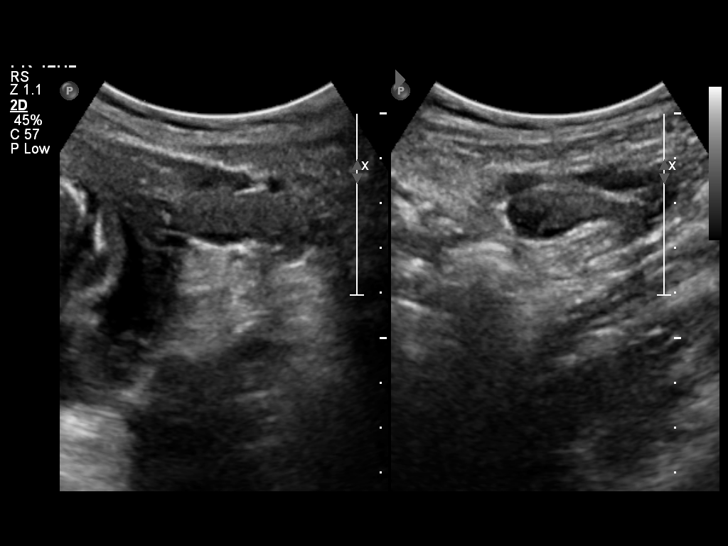
[im 78/81]
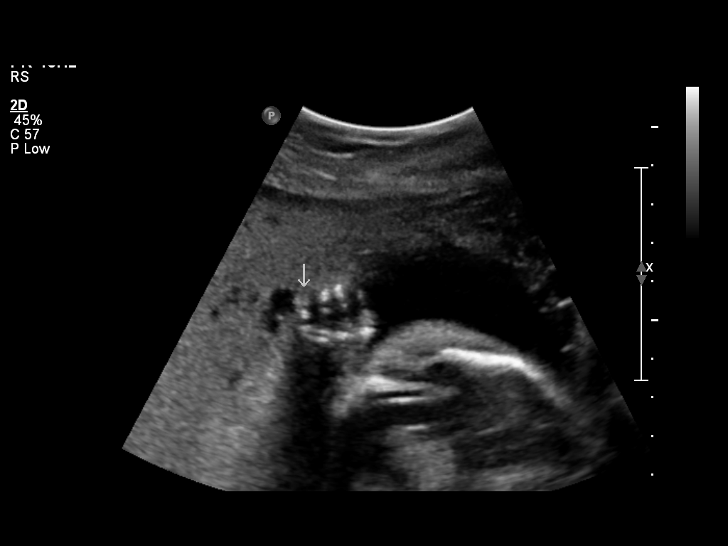

[12 of 28 positions shown; findings below may reference images not displayed]

OBSTETRICS REPORT
                      (Signed Final 03/01/2011 [DATE])

 Order#:         [id number]
Procedures

 US OB DETAIL + 14 WK                                  76811.0
Indications

 Detailed fetal anatomic survey
Fetal Evaluation

 Fetal Heart Rate:  135                          bpm
 Cardiac Activity:  Observed
 Presentation:      Cephalic
 Placenta:          Anterior, above cervical os
 P. Cord            Visualized
 Insertion:

 Amniotic Fluid
 AFI FV:      Subjectively within normal limits
                                             Larg Pckt:     5.4  cm
Biometry

 BPD:     61.8  mm     G. Age:  25w 1d                CI:        77.76   70 - 86
                                                      FL/HC:      18.1   18.7 -

 HC:     221.8  mm     G. Age:  24w 1d       35  %    HC/AC:      1.14   1.05 -

 AC:     195.2  mm     G. Age:  24w 2d       44  %    FL/BPD:     64.9   71 - 87
 FL:      40.1  mm     G. Age:  23w 0d        9  %    FL/AC:      20.5   20 - 24
 HUM:     38.9  mm     G. Age:  23w 6d       34  %

 Est. FW:     629  gm      1 lb 6 oz     44  %
Gestational Age

 LMP:           19w 3d        Date:  10/16/10                 EDD:   07/23/11
 U/S Today:     24w 1d                                        EDD:   06/20/11
 Best:          24w 1d     Det. By:  U/S (03/01/11)           EDD:   06/20/11
Genetic Sonogram - Trisomy 21 Screening

 Age:                                             30          Risk=1:   481
 Echogenic bowel:                                 No          LR :
 Choroid plexus cysts:                            No
 Hypoplastic / absent midphalanx 5th Digit:       No
 Short femur:                                     Yes         LR :
 Short humerus:                                   Yes         LR :
 2-vessel umbilical cord:                         No
 Pyelectasis:                                     No          LR :
 Echogenic cardiac foci:                          No          LR :

 8 Of 8 Criteria Were Visualized and 2 Abnormal(s) Were Seen.
 Ultrasound Modified Risk for Fetal Down Syndrome < [DATE]

 Apparently short extremities without bowing/fracture; this is not
 uncommonly seen in the Asian population.
Anatomy

 Cranium:           Appears normal      Aortic Arch:       Appears normal
 Fetal Cavum:       Appears normal      Ductal Arch:       Appears normal
 Ventricles:        Appears normal      Diaphragm:         Appears normal
 Choroid Plexus:    Appears normal      Stomach:           Appears
                                                           normal, left
                                                           sided
 Cerebellum:        Appears normal      Abdomen:           Appears normal
 Posterior Fossa:   Appears normal      Abdominal Wall:    Appears nml
                                                           (cord insert,
                                                           abd wall)
 Nuchal Fold:       Not applicable      Cord Vessels:      Appears normal
                    (>20 wks GA)                           (3 vessel cord)
 Face:              Lips and orbits     Kidneys:           Appear normal
                    appear normal
 Heart:             Appears normal      Bladder:           Appears normal
                    (4 chamber &
                    axis)
 RVOT:              Appears normal      Spine:             Appears normal
 LVOT:              Appears normal      Limbs:             Appears normal
                                                           (hands, ankles,
                                                           feet)

 Other:     Heels and 5th digit appears normal. Fetus appears to be
            a male.
Cervix Uterus Adnexa

 Cervical Length:    3.1      cm

 Cervix:       Normal appearance by transabdominal scan.
 Uterus:       No abnormality visualized.
 Cul De Sac:   No free fluid seen.
 Left Ovary:    Within normal limits.
 Right Ovary:   Within normal limits.

 Adnexa:     No abnormality visualized.
Impression

 Single live IUP in cephalic presentation.  Best dating likely by
 today's exam, which may indicate inaccuracy of the LMP.
 No anatomic abnormality seen with a good quality survey
 possible.

 questions or concerns.

## 2013-08-10 LAB — OB RESULTS CONSOLE HIV ANTIBODY (ROUTINE TESTING): HIV: NONREACTIVE

## 2014-01-07 ENCOUNTER — Other Ambulatory Visit: Payer: Self-pay | Admitting: Obstetrics and Gynecology

## 2014-01-07 NOTE — H&P (Signed)
Valerie Monroe is a 33 y.o. female presenting for scheduled primary C/S for twins in Vtx/Breech presentation.    Pregnancy course:  Pt began PNC at [facility] at 19wks,  Anatomy US twins, WNL, except limited views F/u US WNL Quad screen nl 1hr gtt WNL Normal routine growth US GBS neg Pt seen in office on 3/2 by Dr Roberts and desires Primary C/S  Maternal Medical History:  Reason for admission: Scheduled primary C/S for twins  Contractions: Frequency: rare.    Fetal activity: Perceived fetal activity is normal.   Last perceived fetal movement was within the past hour.    Prenatal complications: no prenatal complications Prenatal Complications - Diabetes: none.    OB History   Grav Para Term Preterm Abortions TAB SAB Ect Mult Living   1 1 1 0 0 0 0 0 0 1     Past Medical History  Diagnosis Date  . No pertinent past medical history    Past Surgical History  Procedure Laterality Date  . No past surgeries     Family History: family history is not on file. Social History:  reports that she has never smoked. She has never used smokeless tobacco. She reports that she does not drink alcohol or use illicit drugs.   Prenatal Transfer Tool  Maternal Diabetes: No Genetic Screening: Normal Maternal Ultrasounds/Referrals: Normal Fetal Ultrasounds or other Referrals:  None Maternal Substance Abuse:  No Significant Maternal Medications:  None Significant Maternal Lab Results:  Lab values include: Group B Strep negative Other Comments:  None  Review of Systems  All other systems reviewed and are negative.      unknown if currently breastfeeding. Maternal Exam:  Uterine Assessment: Contraction frequency is rare.   Abdomen: Patient reports no abdominal tenderness. Estimated fetal weight is Baby A 5#12oz, Baby B 5#11oz at 35wks .    Introitus: not evaluated.   Cervix: not evaluated.   Fetal Exam Fetal Monitor Review: Mode: hand-held doppler probe.       Physical Exam   Prenatal labs: ABO, Rh:   Bpos 10/20 Antibody:  neg Rubella:  IMM RPR:   NR HBsAg:   neg HIV:   neg GBS:   neg 2/18   Assessment/Plan: Twins Di/Di  IUP at [redacted]w[redacted]d vtx/breech GBS neg Desires primary C/S  Admit to pre-op per c/w Dr Roberts Routine pre-op orders R/B C/S rv'd per Dr Roberts at office visit on 01/07/14     Valerie Monroe M 01/07/2014, 11:10 PM     

## 2014-01-08 ENCOUNTER — Encounter (HOSPITAL_COMMUNITY): Payer: Self-pay

## 2014-01-08 ENCOUNTER — Encounter (HOSPITAL_COMMUNITY): Payer: BC Managed Care – PPO | Admitting: Anesthesiology

## 2014-01-08 ENCOUNTER — Inpatient Hospital Stay (HOSPITAL_COMMUNITY): Payer: BC Managed Care – PPO | Admitting: Anesthesiology

## 2014-01-08 ENCOUNTER — Inpatient Hospital Stay (HOSPITAL_COMMUNITY)
Admission: RE | Admit: 2014-01-08 | Discharge: 2014-01-10 | DRG: 765 | Disposition: A | Discharge status: Home / Self Care | Payer: BC Managed Care – PPO | Source: Ambulatory Visit | Attending: Obstetrics and Gynecology | Admitting: Obstetrics and Gynecology

## 2014-01-08 ENCOUNTER — Encounter (HOSPITAL_COMMUNITY)
Admission: RE | Disposition: A | Discharge status: Home / Self Care | Payer: Self-pay | Source: Ambulatory Visit | Attending: Obstetrics and Gynecology

## 2014-01-08 DIAGNOSIS — O30009 Twin pregnancy, unspecified number of placenta and unspecified number of amniotic sacs, unspecified trimester: Secondary | ICD-10-CM | POA: Diagnosis present

## 2014-01-08 DIAGNOSIS — O1002 Pre-existing essential hypertension complicating childbirth: Secondary | ICD-10-CM | POA: Diagnosis present

## 2014-01-08 DIAGNOSIS — O329XX0 Maternal care for malpresentation of fetus, unspecified, not applicable or unspecified: Principal | ICD-10-CM

## 2014-01-08 DIAGNOSIS — O3090 Multiple gestation, unspecified, unspecified trimester: Principal | ICD-10-CM | POA: Diagnosis present

## 2014-01-08 DIAGNOSIS — Z98891 History of uterine scar from previous surgery: Secondary | ICD-10-CM

## 2014-01-08 HISTORY — DX: Essential (primary) hypertension: I10

## 2014-01-08 LAB — CBC
HCT: 39.1 % (ref 36.0–46.0)
Hemoglobin: 13.3 g/dL (ref 12.0–15.0)
MCH: 29.4 pg (ref 26.0–34.0)
MCHC: 34 g/dL (ref 30.0–36.0)
MCV: 86.5 fL (ref 78.0–100.0)
Platelets: 271 10*3/uL (ref 150–400)
RBC: 4.52 MIL/uL (ref 3.87–5.11)
RDW: 13.8 % (ref 11.5–15.5)
WBC: 10.3 10*3/uL (ref 4.0–10.5)

## 2014-01-08 LAB — BASIC METABOLIC PANEL
BUN: 13 mg/dL (ref 6–23)
CO2: 23 meq/L (ref 19–32)
Calcium: 8.9 mg/dL (ref 8.4–10.5)
Chloride: 102 mEq/L (ref 96–112)
Creatinine, Ser: 0.69 mg/dL (ref 0.50–1.10)
GFR calc Af Amer: >90 mL/min (ref >90)
GLUCOSE: 77 mg/dL (ref 70–99)
Potassium: 4.2 mEq/L (ref 3.7–5.3)
Sodium: 138 mEq/L (ref 137–147)

## 2014-01-08 LAB — RPR: RPR: NONREACTIVE

## 2014-01-08 LAB — TYPE AND SCREEN
ABO/RH(D): B POS
Antibody Screen: NEGATIVE

## 2014-01-08 LAB — ABO/RH: ABO/RH(D): B POS

## 2014-01-08 SURGERY — Surgical Case
Anesthesia: Spinal | Site: Abdomen

## 2014-01-08 MED ORDER — SCOPOLAMINE 1 MG/3DAYS TD PT72
1.0000 | MEDICATED_PATCH | Freq: Once | TRANSDERMAL | Status: DC
  Administered 2014-01-08: 1.5 mg via TRANSDERMAL

## 2014-01-08 MED ORDER — IBUPROFEN 600 MG PO TABS
600.0000 mg | ORAL_TABLET | Freq: Four times a day (QID) | ORAL | Status: DC
  Administered 2014-01-08 – 2014-01-10 (×7): 600 mg via ORAL
  Filled 2014-01-08 (×7): qty 1

## 2014-01-08 MED ORDER — SIMETHICONE 80 MG PO CHEW: 80.0000 mg | CHEWABLE_TABLET | ORAL | Status: DC | PRN

## 2014-01-08 MED ORDER — PHENYLEPHRINE 8 MG IN D5W 100 ML (0.08MG/ML) PREMIX OPTIME
INJECTION | INTRAVENOUS | Status: DC | PRN
  Administered 2014-01-08: 60 ug/min via INTRAVENOUS

## 2014-01-08 MED ORDER — MENTHOL 3 MG MT LOZG
1.0000 | LOZENGE | OROMUCOSAL | Status: DC | PRN
Start: 2014-01-08 — End: 2014-01-10

## 2014-01-08 MED ORDER — NALBUPHINE HCL 10 MG/ML IJ SOLN: 5.0000 mg | INTRAMUSCULAR | Status: DC | PRN

## 2014-01-08 MED ORDER — LACTATED RINGERS IV SOLN
INTRAVENOUS | Status: DC | PRN
  Administered 2014-01-08 (×3): via INTRAVENOUS

## 2014-01-08 MED ORDER — 0.9 % SODIUM CHLORIDE (POUR BTL) OPTIME
TOPICAL | Status: DC | PRN
  Administered 2014-01-08: 1000 mL

## 2014-01-08 MED ORDER — DIBUCAINE 1 % RE OINT: 1.0000 "application " | TOPICAL_OINTMENT | RECTAL | Status: DC | PRN

## 2014-01-08 MED ORDER — MORPHINE SULFATE 0.5 MG/ML IJ SOLN
INTRAMUSCULAR | Status: AC
  Filled 2014-01-08: qty 10

## 2014-01-08 MED ORDER — DIPHENHYDRAMINE HCL 25 MG PO CAPS
25.0000 mg | ORAL_CAPSULE | ORAL | Status: DC | PRN
Start: 2014-01-08 — End: 2014-01-10
  Filled 2014-01-08: qty 1

## 2014-01-08 MED ORDER — SIMETHICONE 80 MG PO CHEW
80.0000 mg | CHEWABLE_TABLET | ORAL | Status: DC
  Administered 2014-01-08 – 2014-01-10 (×2): 80 mg via ORAL
  Filled 2014-01-08 (×2): qty 1

## 2014-01-08 MED ORDER — KETOROLAC TROMETHAMINE 30 MG/ML IJ SOLN: 30.0000 mg | Freq: Four times a day (QID) | INTRAMUSCULAR | Status: AC | PRN

## 2014-01-08 MED ORDER — TETANUS-DIPHTH-ACELL PERTUSSIS 5-2.5-18.5 LF-MCG/0.5 IM SUSP: 0.5000 mL | Freq: Once | INTRAMUSCULAR | Status: DC

## 2014-01-08 MED ORDER — OXYCODONE-ACETAMINOPHEN 5-325 MG PO TABS
1.0000 | ORAL_TABLET | ORAL | Status: DC | PRN
  Administered 2014-01-09 – 2014-01-10 (×5): 1 via ORAL
  Filled 2014-01-08 (×5): qty 1

## 2014-01-08 MED ORDER — DIPHENHYDRAMINE HCL 50 MG/ML IJ SOLN: 25.0000 mg | INTRAMUSCULAR | Status: DC | PRN

## 2014-01-08 MED ORDER — NALOXONE HCL 0.4 MG/ML IJ SOLN: 0.4000 mg | INTRAMUSCULAR | Status: DC | PRN

## 2014-01-08 MED ORDER — SCOPOLAMINE 1 MG/3DAYS TD PT72
MEDICATED_PATCH | TRANSDERMAL | Status: AC
  Administered 2014-01-08: 1.5 mg via TRANSDERMAL
  Filled 2014-01-08: qty 1

## 2014-01-08 MED ORDER — OXYTOCIN 40 UNITS IN LACTATED RINGERS INFUSION - SIMPLE MED: 62.5000 mL/h | INTRAVENOUS | Status: AC

## 2014-01-08 MED ORDER — ZOLPIDEM TARTRATE 5 MG PO TABS
5.0000 mg | ORAL_TABLET | Freq: Every evening | ORAL | Status: DC | PRN
Start: 2014-01-08 — End: 2014-01-10

## 2014-01-08 MED ORDER — LACTATED RINGERS IV SOLN
Freq: Once | INTRAVENOUS | Status: AC
  Administered 2014-01-08: 10:00:00 via INTRAVENOUS

## 2014-01-08 MED ORDER — SCOPOLAMINE 1 MG/3DAYS TD PT72: 1.0000 | MEDICATED_PATCH | Freq: Once | TRANSDERMAL | Status: DC

## 2014-01-08 MED ORDER — FENTANYL CITRATE 0.05 MG/ML IJ SOLN
INTRAMUSCULAR | Status: AC
  Filled 2014-01-08: qty 2

## 2014-01-08 MED ORDER — SENNOSIDES-DOCUSATE SODIUM 8.6-50 MG PO TABS
2.0000 | ORAL_TABLET | ORAL | Status: DC
  Administered 2014-01-08 – 2014-01-10 (×2): 2 via ORAL
  Filled 2014-01-08 (×2): qty 2

## 2014-01-08 MED ORDER — ONDANSETRON HCL 4 MG/2ML IJ SOLN
INTRAMUSCULAR | Status: AC
  Filled 2014-01-08: qty 2

## 2014-01-08 MED ORDER — OXYTOCIN 10 UNIT/ML IJ SOLN
INTRAMUSCULAR | Status: AC
  Filled 2014-01-08: qty 4

## 2014-01-08 MED ORDER — KETOROLAC TROMETHAMINE 30 MG/ML IJ SOLN
INTRAMUSCULAR | Status: AC
Start: 2014-01-08 — End: 2014-01-09
  Filled 2014-01-08: qty 1

## 2014-01-08 MED ORDER — LACTATED RINGERS IV SOLN
INTRAVENOUS | Status: DC | PRN
  Administered 2014-01-08: 11:00:00 via INTRAVENOUS

## 2014-01-08 MED ORDER — LACTATED RINGERS IV SOLN
INTRAVENOUS | Status: DC
  Administered 2014-01-09: via INTRAVENOUS

## 2014-01-08 MED ORDER — FENTANYL CITRATE 0.05 MG/ML IJ SOLN
INTRAMUSCULAR | Status: DC | PRN
  Administered 2014-01-08: 25 ug via INTRATHECAL

## 2014-01-08 MED ORDER — PHENYLEPHRINE 8 MG IN D5W 100 ML (0.08MG/ML) PREMIX OPTIME
INJECTION | INTRAVENOUS | Status: AC
  Filled 2014-01-08: qty 100

## 2014-01-08 MED ORDER — ONDANSETRON HCL 4 MG/2ML IJ SOLN
INTRAMUSCULAR | Status: DC | PRN
  Administered 2014-01-08: 4 mg via INTRAVENOUS

## 2014-01-08 MED ORDER — ONDANSETRON HCL 4 MG PO TABS: 4.0000 mg | ORAL_TABLET | ORAL | Status: DC | PRN

## 2014-01-08 MED ORDER — LANOLIN HYDROUS EX OINT: 1.0000 "application " | TOPICAL_OINTMENT | CUTANEOUS | Status: DC | PRN

## 2014-01-08 MED ORDER — CEFAZOLIN SODIUM-DEXTROSE 2-3 GM-% IV SOLR: 2.0000 g | INTRAVENOUS | Status: DC

## 2014-01-08 MED ORDER — PRENATAL MULTIVITAMIN CH
1.0000 | ORAL_TABLET | Freq: Every day | ORAL | Status: DC
  Administered 2014-01-09 – 2014-01-10 (×2): 1 via ORAL
  Filled 2014-01-08 (×2): qty 1

## 2014-01-08 MED ORDER — FENTANYL CITRATE 0.05 MG/ML IJ SOLN: 25.0000 ug | INTRAMUSCULAR | Status: DC | PRN

## 2014-01-08 MED ORDER — METOCLOPRAMIDE HCL 5 MG/ML IJ SOLN: 10.0000 mg | Freq: Three times a day (TID) | INTRAMUSCULAR | Status: DC | PRN

## 2014-01-08 MED ORDER — SODIUM CHLORIDE 0.9 % IJ SOLN: 3.0000 mL | INTRAMUSCULAR | Status: DC | PRN

## 2014-01-08 MED ORDER — WITCH HAZEL-GLYCERIN EX PADS: 1.0000 "application " | MEDICATED_PAD | CUTANEOUS | Status: DC | PRN

## 2014-01-08 MED ORDER — ONDANSETRON HCL 4 MG/2ML IJ SOLN
4.0000 mg | Freq: Three times a day (TID) | INTRAMUSCULAR | Status: DC | PRN
Start: 2014-01-08 — End: 2014-01-10

## 2014-01-08 MED ORDER — KETOROLAC TROMETHAMINE 30 MG/ML IJ SOLN
30.0000 mg | Freq: Four times a day (QID) | INTRAMUSCULAR | Status: AC | PRN
  Administered 2014-01-08: 30 mg via INTRAVENOUS

## 2014-01-08 MED ORDER — DIPHENHYDRAMINE HCL 50 MG/ML IJ SOLN: 12.5000 mg | INTRAMUSCULAR | Status: DC | PRN

## 2014-01-08 MED ORDER — MORPHINE SULFATE (PF) 0.5 MG/ML IJ SOLN
INTRAMUSCULAR | Status: DC | PRN
  Administered 2014-01-08: .15 mg via INTRATHECAL

## 2014-01-08 MED ORDER — OXYTOCIN 10 UNIT/ML IJ SOLN
40.0000 [IU] | INTRAVENOUS | Status: DC | PRN
  Administered 2014-01-08: 40 [IU] via INTRAVENOUS

## 2014-01-08 MED ORDER — DIPHENHYDRAMINE HCL 25 MG PO CAPS: 25.0000 mg | ORAL_CAPSULE | Freq: Four times a day (QID) | ORAL | Status: DC | PRN

## 2014-01-08 MED ORDER — NALOXONE HCL 1 MG/ML IJ SOLN
1.0000 ug/kg/h | INTRAVENOUS | Status: DC | PRN
  Filled 2014-01-08: qty 2

## 2014-01-08 MED ORDER — ONDANSETRON HCL 4 MG/2ML IJ SOLN
4.0000 mg | INTRAMUSCULAR | Status: DC | PRN
Start: 2014-01-08 — End: 2014-01-10

## 2014-01-08 MED ORDER — CEFAZOLIN SODIUM-DEXTROSE 2-3 GM-% IV SOLR
INTRAVENOUS | Status: AC
  Filled 2014-01-08: qty 50

## 2014-01-08 MED ORDER — SIMETHICONE 80 MG PO CHEW
80.0000 mg | CHEWABLE_TABLET | Freq: Three times a day (TID) | ORAL | Status: DC
  Administered 2014-01-09 – 2014-01-10 (×4): 80 mg via ORAL
  Filled 2014-01-08 (×4): qty 1

## 2014-01-08 MED ORDER — MEPERIDINE HCL 25 MG/ML IJ SOLN: 6.2500 mg | INTRAMUSCULAR | Status: DC | PRN

## 2014-01-08 SURGICAL SUPPLY — 37 items
BENZOIN TINCTURE PRP APPL 2/3 (GAUZE/BANDAGES/DRESSINGS) ×3 IMPLANT
CLAMP CORD UMBIL (MISCELLANEOUS) ×6 IMPLANT
CLOSURE WOUND 1/2 X4 (GAUZE/BANDAGES/DRESSINGS) ×1
CLOTH BEACON ORANGE TIMEOUT ST (SAFETY) ×3 IMPLANT
CONTAINER PREFILL 10% NBF 15ML (MISCELLANEOUS) IMPLANT
DRAPE LG THREE QUARTER DISP (DRAPES) ×6 IMPLANT
DRSG OPSITE POSTOP 4X10 (GAUZE/BANDAGES/DRESSINGS) ×3 IMPLANT
DURAPREP 26ML APPLICATOR (WOUND CARE) ×3 IMPLANT
ELECT REM PT RETURN 9FT ADLT (ELECTROSURGICAL) ×3
ELECTRODE REM PT RTRN 9FT ADLT (ELECTROSURGICAL) ×1 IMPLANT
EXTRACTOR VACUUM M CUP 4 TUBE (SUCTIONS) IMPLANT
EXTRACTOR VACUUM M CUP 4' TUBE (SUCTIONS)
GLOVE BIO SURGEON STRL SZ7.5 (GLOVE) ×3 IMPLANT
GLOVE BIOGEL PI IND STRL 7.5 (GLOVE) ×1 IMPLANT
GLOVE BIOGEL PI INDICATOR 7.5 (GLOVE) ×2
GOWN STRL REUS W/ TWL XL LVL3 (GOWN DISPOSABLE) ×1 IMPLANT
GOWN STRL REUS W/TWL LRG LVL3 (GOWN DISPOSABLE) ×3 IMPLANT
GOWN STRL REUS W/TWL XL LVL3 (GOWN DISPOSABLE) ×2
KIT ABG SYR 3ML LUER SLIP (SYRINGE) IMPLANT
NEEDLE HYPO 25X5/8 SAFETYGLIDE (NEEDLE) IMPLANT
NS IRRIG 1000ML POUR BTL (IV SOLUTION) ×3 IMPLANT
PACK C SECTION WH (CUSTOM PROCEDURE TRAY) ×3 IMPLANT
PAD OB MATERNITY 4.3X12.25 (PERSONAL CARE ITEMS) ×3 IMPLANT
RETRACTOR WND ALEXIS 25 LRG (MISCELLANEOUS) ×1 IMPLANT
RTRCTR WOUND ALEXIS 25CM LRG (MISCELLANEOUS) ×3
STRIP CLOSURE SKIN 1/2X4 (GAUZE/BANDAGES/DRESSINGS) ×2 IMPLANT
SUT CHROMIC 2 0 CT 1 (SUTURE) ×3 IMPLANT
SUT MNCRL AB 3-0 PS2 27 (SUTURE) ×3 IMPLANT
SUT PLAIN 0 NONE (SUTURE) IMPLANT
SUT PLAIN 2 0 XLH (SUTURE) ×3 IMPLANT
SUT VIC AB 0 CT1 36 (SUTURE) ×6 IMPLANT
SUT VIC AB 0 CTX 36 (SUTURE) ×6
SUT VIC AB 0 CTX36XBRD ANBCTRL (SUTURE) ×3 IMPLANT
SYR BULB IRRIGATION 50ML (SYRINGE) ×6 IMPLANT
TOWEL OR 17X24 6PK STRL BLUE (TOWEL DISPOSABLE) ×3 IMPLANT
TRAY FOLEY CATH 14FR (SET/KITS/TRAYS/PACK) ×3 IMPLANT
WATER STERILE IRR 1000ML POUR (IV SOLUTION) IMPLANT

## 2014-01-08 NOTE — Transfer of Care (Signed)
Immediate Anesthesia Transfer of Care Note  Patient: Valerie Monroe  Procedure(s) Performed: Procedure(s): PRIMARY CESAREAN SECTION (N/A)  Patient Location: PACU  Anesthesia Type:Spinal  Level of Consciousness: awake, alert  and oriented  Airway & Oxygen Therapy: Patient Spontanous Breathing  Post-op Assessment: Report given to PACU RN and Post -op Vital signs reviewed and stable  Post vital signs: Reviewed and stable  Complications: No apparent anesthesia complications

## 2014-01-08 NOTE — Op Note (Addendum)
Cesarean Section Procedure Note  Indications: Term Twin Pregnancy with vtx/breech presentation for c-section.  Pre-operative Diagnosis: CESAREAN SECTION- VERTEX AND BREECH TWINS   Post-operative Diagnosis: CESAREAN SECTION- VERTEX AND BREECH TWINS  Procedure: CESAREAN SECTION  Surgeon: Purcell Nails, MD    Assistants: surgical tech  Anesthesia: Spinal  Anesthesiologist: Cristela Blue, MD  Procedure Details  The patient was taken to the operating room secondary to twin pregnancy with breech twin B after the risks, benefits, complications, treatment options, and expected outcomes were discussed with the patient.  The patient concurred with the proposed plan, giving informed consent which was signed and witnessed. The patient was taken to Operating Room C-Section Suite, identified as Valerie Monroe and the procedure verified as C-Section Delivery. A Time Out was held and the above information confirmed.  After induction of anesthesia by obtaining a spinal, the patient was prepped and draped in the usual sterile manner. A Pfannenstiel skin incision was made and carried down through the subcutaneous tissue to the underlying layer of fascia.  The fascia was incised bilaterally and extended transversely bilaterally with the Mayo scissors. Kocher clamps were placed on the inferior aspect of the fascial incision and the underlying rectus muscle was separated from the fascia. The same was done on the superior aspect of the fascial incision.  The peritoneum was identified, entered bluntly and extended manually.  An Alexis self-retaining retractor was placed.  The utero-vesical peritoneal reflection was incised transversely and the bladder flap was bluntly freed from the lower uterine segment. A low transverse uterine incision was made with the scalpel and extended bilaterally with the bandage scissors.  Twin A was delivered in vertex position without difficulty after rupturing membranes with clear fluid  noted.  After the umbilical cord was clamped and cut, the infant was handed to the awaiting pediatricians.  The cord was singly clamped.  Twin B was delivered via breech extraction.  After the umbilical cord was clamped and cut, the infant was handed to the awaiting pediatricians. Cord blood was obtained for evaluation x 2.  The placentas were removed intact and appeared to be within normal limits. The uterus was cleared of all clots and debris. The uterine incision was closed with running interlocking sutures of 0 Vicryl and a second imbricating layer was performed as well.   Bilateral tubes and ovaries appeared to be within normal limits.  Good hemostasis was noted.  Copious irrigation was performed until clear.  The peritoneum was repaired with 2-0 chromic via a running suture.  The fascia was reapproximated with a running suture of 0 Vicryl. The subcutaneous tissue was reapproximated with 3 interrupted sutures of 2-0 plain.  The skin was reapproximated with a subcuticular suture of 3-0 monocryl.  Steristrips were applied.  Instrument, sponge, and needle counts were correct prior to abdominal closure and at the conclusion of the case.  The patient was awaiting transfer to the recovery room in good condition.  Findings: Twin A Live female infant with Apgars 8 at one minute and 9 at five minutes.  Twin B Live female infant with Apgars 8 at one minute and 8 at five minutes.  Normal appearing bilateral ovaries and fallopian tubes were noted.  Interpreter [phone] via language line says pt and husband verbalized they do not want a circumcision.  Estimated Blood Loss:  600 ml         Drains: foley to gravity 200 cc          Total IV Fluids: 3000  ml         Specimens to Pathology: Placenta         Complications:  None; patient tolerated the procedure well.         Disposition: PACU - hemodynamically stable.         Condition: stable  Attending Attestation: I performed the procedure.

## 2014-01-08 NOTE — H&P (View-Only) (Signed)
Valerie Monroe is a 33 y.o. female presenting for scheduled primary C/S for twins in Vtx/Breech presentation.    [facility]  [facility]  Anatomy US twins, WNL, except limited views F/u US WNL Quad screen nl 1hr gtt WNL Normal routine growth US GBS neg Pt seen in office on 3/2 by Dr Su Hiltoberts and desires Primary C/S  Maternal Medical History:  Reason for admission: Scheduled primary C/S for twins  Contractions: Frequency: rare.    Fetal activity: Perceived fetal activity is normal.   Last perceived fetal movement was within the past hour.    Prenatal complications: no prenatal complications Prenatal Complications - Diabetes: none.    OB History   Grav Para Term Preterm Abortions TAB SAB Ect Mult Living   1 1 1  0 0 0 0 0 0 1     Past Medical History  Diagnosis Date  . No pertinent past medical history    Past Surgical History  Procedure Laterality Date  . No past surgeries     Family History: family history is not on file. Social History:  reports that she has never smoked. She has never used smokeless tobacco. She reports that she does not drink alcohol or use illicit drugs.   Prenatal Transfer Tool  Maternal Diabetes: No Genetic Screening: Normal Maternal Ultrasounds/Referrals: Normal Fetal Ultrasounds or other Referrals:  None Maternal Substance Abuse:  No Significant Maternal Medications:  None Significant Maternal Lab Results:  Lab values include: Group B Strep negative Other Comments:  None  Review of Systems  All other systems reviewed and are negative.      unknown if currently breastfeeding. Maternal Exam:  Uterine Assessment: Contraction frequency is rare.   Abdomen: Patient reports no abdominal tenderness. Estimated fetal weight is Baby A 5#12oz, Baby B 5#11oz at 35wks .    Introitus: not evaluated.   Cervix: not evaluated.   Fetal Exam Fetal Monitor Review: Mode: hand-held doppler probe.       Physical Exam   Prenatal labs: ABO, Rh:   Bpos 10/20 Antibody:  neg Rubella:  IMM RPR:   NR HBsAg:   neg HIV:   neg GBS:   neg 2/18   Assessment/Plan: Twins Di/Di  IUP at 5172w2d vtx/breech GBS neg Desires primary C/S  Admit to pre-op per c/w Dr Su Hiltoberts Routine pre-op orders R/B C/S rv'd per Dr Su Hiltoberts at office visit on 01/07/14     Valerie Monroe 01/07/2014, 11:10 PM

## 2014-01-08 NOTE — Lactation Note (Signed)
This note was copied from the chart of Valerie Basya Lantigua. Lactation Consultation Note  Patient Name: Valerie Monroe Today's Date: 01/08/2014 Reason for consult: Initial assessment;Other (Comment) (charting for exclusion)   Maternal Data Formula Feeding for Exclusion: Yes Reason for exclusion: Mother's choice to formula feed on admision  Feeding Feeding Type: Bottle Fed - Formula Nipple Type: Slow - flow  LATCH Score/Interventions                      Lactation Tools Discussed/Used     Consult Status Consult Status: Complete    Maalle Starrett Parmly 01/08/2014, 5:33 PM    

## 2014-01-08 NOTE — Interval H&P Note (Signed)
History and Physical Interval Note:  01/08/2014 10:32 AM  Valerie Monroe  has presented today for surgery, with the diagnosis of CESAREAN SECTION- VERTEX AND BREACH TWINS  The various methods of treatment have been discussed with the patient and family. After consideration of risks, benefits and other options for treatment, the patient has consented to  Procedure(s): PRIMARY CESAREAN SECTION (N/A) as a surgical intervention .  The patient's history has been reviewed, patient examined, no change in status, stable for surgery.  I have reviewed the patient's chart and labs.  Questions were answered to the patient's satisfaction.   FHR A 130s B140s  Arnetha Silverthorne Y

## 2014-01-08 NOTE — Preoperative (Signed)
Beta Blockers   Reason not to administer Beta Blockers:Not Applicable 

## 2014-01-08 NOTE — Anesthesia Preprocedure Evaluation (Signed)
Anesthesia Evaluation  Patient identified by MRN, date of birth, ID band Patient awake    Reviewed: Allergy & Precautions, H&P , Patient's Chart, lab work & pertinent test results  Airway Mallampati: II TM Distance: >3 FB Neck ROM: full    Dental no notable dental hx.    Pulmonary  breath sounds clear to auscultation  Pulmonary exam normal       Cardiovascular Exercise Tolerance: Good hypertension, Rhythm:regular Rate:Normal     Neuro/Psych    GI/Hepatic   Endo/Other    Renal/GU      Musculoskeletal   Abdominal   Peds  Hematology   Anesthesia Other Findings Discussed thru language phone translator  Reproductive/Obstetrics                           Anesthesia Physical Anesthesia Plan  ASA: II  Anesthesia Plan: Spinal   Post-op Pain Management:    Induction:   Airway Management Planned:   Additional Equipment:   Intra-op Plan:   Post-operative Plan:   Informed Consent: I have reviewed the patients History and Physical, chart, labs and discussed the procedure including the risks, benefits and alternatives for the proposed anesthesia with the patient or authorized representative who has indicated his/her understanding and acceptance.   Dental Advisory Given  Plan Discussed with: CRNA  Anesthesia Plan Comments: (Lab work confirmed with CRNA in room. Platelets okay. Discussed spinal anesthetic, and patient consents to the procedure:  included risk of possible headache,backache, failed block, allergic reaction, and nerve injury. This patient was asked if she had any questions or concerns before the procedure started. )        Anesthesia Quick Evaluation

## 2014-01-08 NOTE — Anesthesia Procedure Notes (Signed)

## 2014-01-08 NOTE — Progress Notes (Signed)
Baby B 135

## 2014-01-09 ENCOUNTER — Encounter (HOSPITAL_COMMUNITY): Payer: Self-pay | Admitting: Obstetrics and Gynecology

## 2014-01-09 LAB — CBC
HCT: 29.3 % — ABNORMAL LOW (ref 36.0–46.0)
HEMOGLOBIN: 9.9 g/dL — AB (ref 12.0–15.0)
MCH: 28.8 pg (ref 26.0–34.0)
MCHC: 33.8 g/dL (ref 30.0–36.0)
MCV: 85.2 fL (ref 78.0–100.0)
Platelets: 228 10*3/uL (ref 150–400)
RBC: 3.44 MIL/uL — AB (ref 3.87–5.11)
RDW: 13.9 % (ref 11.5–15.5)
WBC: 14.5 10*3/uL — ABNORMAL HIGH (ref 4.0–10.5)

## 2014-01-09 NOTE — Progress Notes (Signed)
Patient does not speak good [place]. Contacted [place] interpretor through [employer]. Assessed patient with the help of the interpreter.

## 2014-01-09 NOTE — Progress Notes (Signed)
Interpreter [phone] from [employer] used for assessment of patient, teaching progression of bowel and bladder care, IV removal, and pain assessment and medication.

## 2014-01-09 NOTE — Progress Notes (Signed)
Subjective: Postpartum Day 1: Cesarean Delivery for twins Patient reports that pain is well-managed.Lochia normal.  Ambulating, voiding, tolerating diet as ordered without difficulty. Normal flatus. Absent bowel movement.  Objective: Vital signs in last 24 hours: Temp:  [97.5 F (36.4 C)-99.7 F (37.6 C)] 97.5 F (36.4 C) (03/04 0730) Pulse Rate:  [64-94] 64 (03/04 0730) Resp:  [15-18] 16 (03/04 0730) BP: (91-132)/(60-78) 91/60 mmHg (03/04 0730) SpO2:  [95 %-98 %] 97 % (03/04 0730)  Physical Exam:  General: alert Lochia: appropriate Uterine Fundus: firm and appropriately tender Incision: healing well DVT Evaluation: No evidence of DVT seen on physical exam. Edema minimal   Recent Labs  01/08/14 0920 01/09/14 0555  HGB 13.3 9.9*  HCT 39.1 29.3*    Assessment/Plan: Status post Cesarean section. Doing well postoperatively.  Continue current care. Anticipate discharge tomorrow  Gareth Fitzner A 01/09/2014, 2:39 PM

## 2014-01-09 NOTE — Anesthesia Postprocedure Evaluation (Signed)
  Anesthesia Post-op Note  Patient: Valerie Monroe  Procedure(s) Performed: Procedure(s): PRIMARY CESAREAN SECTION (N/A)  Patient is awake, responsive, moving her legs, and has signs of resolution of her numbness. Pain and nausea are reasonably well controlled. Vital signs are stable and clinically acceptable. Oxygen saturation is clinically acceptable. There are no apparent anesthetic complications at this time. Patient is ready for discharge.

## 2014-01-09 NOTE — Progress Notes (Signed)
Admission teaching done using interpreter [phone] from [employer].

## 2014-01-10 MED ORDER — IBUPROFEN 600 MG PO TABS: 600.0000 mg | ORAL_TABLET | Freq: Four times a day (QID) | ORAL | Status: AC

## 2014-01-10 MED ORDER — FERROUS SULFATE 325 (65 FE) MG PO TABS: 325.0000 mg | ORAL_TABLET | Freq: Two times a day (BID) | ORAL | Status: AC

## 2014-01-10 MED ORDER — OXYCODONE-ACETAMINOPHEN 5-325 MG PO TABS: 1.0000 | ORAL_TABLET | ORAL | Status: AC | PRN

## 2014-01-10 NOTE — Discharge Summary (Signed)
Cesarean Section Delivery Discharge Summary  Valerie BashChung Branum  DOB:    1981/10/13 MRN:    [id number] CSN:    [id number]  Date of admission:                  01/08/14  Date of discharge:                   01/10/14  Procedures this admission: Primary c/s for vertex/breech presentation of twins  Date of Delivery: 01/08/14  Newborn Data:    Kateri Mcguyen, BoyA Chevella [[id number]][[id number]]  Live born female  Birth Weight: 5 lb 8 oz (2495 g) APGAR: 8, 9   Novella Robguyen, GirlB Kortlynn [[id number]][[id number]]  Live born female  Birth Weight: 5 lb 1.3 oz (2305 g) APGAR: 8, 8  Home with mother. Circumcision Plan: none  History of Present Illness:  Ms. Valerie Monroe is a 33 y.o. female, G2P2003, who presents at 599w0d weeks gestation. [facility]    Her pregnancy has been complicated by:  Patient Active Problem List   Diagnosis Date Noted  . S/P C-section 01/08/2014    Hospital course:  The patient was admitted for scheduled c/s.   Her postpartum course was not complicated.  She was discharged to home on postpartum day 2 doing well.  Feeding:  bottle  Contraception:  IUD  Discharge hemoglobin:  Hemoglobin  Date Value Ref Range Status  01/09/2014 9.9* 12.0 - 15.0 g/dL Final     REPEATED TO VERIFY     DELTA CHECK NOTED     HCT  Date Value Ref Range Status  01/09/2014 29.3* 36.0 - 46.0 % Final    Discharge Physical Exam:   General: alert, cooperative and no distress Lochia: appropriate Uterine Fundus: firm Incision: healing well DVT Evaluation: No evidence of DVT seen on physical exam. Negative Homan's sign.  Intrapartum Procedures: cesarean: low cervical, transverse Postpartum Procedures: none Complications-Operative and Postpartum: none  Discharge Diagnoses: Term Pregnancy-delivered  Discharge Information:  Activity:           pelvic rest Diet:                routine Medications: PNV,  Ibuprofen, Iron and Percocet Condition:      stable Instructions:  Care After Cesarean Delivery  Refer to this sheet in the next few weeks. These instructions provide you with information on caring for yourself after your procedure. Your caregiver may also give you specific instructions. Your treatment has been planned according to current medical practices, but problems sometimes occur. Call your caregiver if you have any problems or questions after you go home. HOME CARE INSTRUCTIONS  Only take over-the-counter or prescription medicines as directed by your caregiver.  Do not drink alcohol, especially if you are breastfeeding or taking medicine to relieve pain.  Do not chew or smoke tobacco.  Continue to use good perineal care. Good perineal care includes:  Wiping your perineum from front to back.  Keeping your perineum clean.  Check your cut (incision) daily for increased redness, drainage, swelling, or separation of skin.  Clean your incision gently with soap and water every day, and then pat it dry. If your caregiver says it is okay, leave the incision uncovered. Use a bandage (dressing) if the incision is draining fluid or appears irritated. If the adhesive strips across the incision do not fall off within 7 days, carefully peel them off.  Hug  a pillow when coughing or sneezing until your incision is healed. This helps to relieve pain.  Do not use tampons or douche until your caregiver says it is okay.  Shower, wash your hair, and take tub baths as directed by your caregiver.  Wear a well-fitting bra that provides breast support.  Limit wearing support panties or control-top hose.  Drink enough fluids to keep your urine clear or pale yellow.  Eat high-fiber foods such as whole grain cereals and breads, brown rice, beans, and fresh fruits and vegetables every day. These foods may help prevent or relieve constipation.  Resume activities such as climbing stairs, driving,  lifting, exercising, or traveling as directed by your caregiver.  Talk to your caregiver about resuming sexual activities. This is dependent upon your risk of infection, your rate of healing, and your comfort and desire to resume sexual activity.  Try to have someone help you with your household activities and your newborn for at least a few days after you leave the hospital.  Rest as much as possible. Try to rest or take a nap when your newborn is sleeping.  Increase your activities gradually.  Keep all of your scheduled postpartum appointments. It is very important to keep your scheduled follow-up appointments. At these appointments, your caregiver will be checking to make sure that you are healing physically and emotionally. SEEK MEDICAL CARE IF:   You are passing large clots from your vagina. Save any clots to show your caregiver.  You have a foul smelling discharge from your vagina.  You have trouble urinating.  You are urinating frequently.  You have pain when you urinate.  You have a change in your bowel movements.  You have increasing redness, pain, or swelling near your incision.  You have pus draining from your incision.  Your incision is separating.  You have painful, hard, or reddened breasts.  You have a severe headache.  You have blurred vision or see spots.  You feel sad or depressed.  You have thoughts of hurting yourself or your newborn.  You have questions about your care, the care of your newborn, or medicines.  You are dizzy or lightheaded.  You have a rash.  You have pain, redness, or swelling at the site of the removed intravenous access (IV) tube.  You have nausea or vomiting.  You stopped breastfeeding and have not had a menstrual period within 12 weeks of stopping.  You are not breastfeeding and have not had a menstrual period within 12 weeks of delivery.  You have a fever. SEEK IMMEDIATE MEDICAL CARE IF:  You have persistent  pain.  You have chest pain.  You have shortness of breath.  You faint.  You have leg pain.  You have stomach pain.  Your vaginal bleeding saturates 2 or more sanitary pads in 1 hour. MAKE SURE YOU:   Understand these instructions.  Will watch your condition.  Will get help right away if you are not doing well or get worse. Document Released: 07/17/2002 Document Revised: 07/19/2012 Document Reviewed: 06/21/2012 [facility] Patient Information 2014 [place], [place].   Postpartum Depression and Baby Blues  The postpartum period begins right after the birth of a baby. During this time, there is often a great amount of joy and excitement. It is also a time of considerable changes in the life of the parent(s). Regardless of how many times a mother gives birth, each child brings new challenges and dynamics to the family. It is not unusual to  have feelings of excitement accompanied by confusing shifts in moods, emotions, and thoughts. All mothers are at risk of developing postpartum depression or the "baby blues." These mood changes can occur right after giving birth, or they may occur many months after giving birth. The baby blues or postpartum depression can be mild or severe. Additionally, postpartum depression can resolve rather quickly, or it can be a long-term condition. CAUSES Elevated hormones and their rapid decline are thought to be a main cause of postpartum depression and the baby blues. There are a number of hormones that radically change during and after pregnancy. Estrogen and progesterone usually decrease immediately after delivering your baby. The level of thyroid hormone and various cortisol steroids also rapidly drop. Other factors that play a major role in these changes include major life events and genetics.  RISK FACTORS If you have any of the following risks for the baby blues or postpartum depression, know what symptoms to watch out for during the postpartum period. Risk  factors that may increase the likelihood of getting the baby blues or postpartum depression include:  Havinga personal or family history of depression.  Having depression while being pregnant.  Having premenstrual or oral contraceptive-associated mood issues.  Having exceptional life stress.  Having marital conflict.  Lacking a social support network.  Having a baby with special needs.  Having health problems such as diabetes. SYMPTOMS Baby blues symptoms include:  Brief fluctuations in mood, such as going from extreme happiness to sadness.  Decreased concentration.  Difficulty sleeping.  Crying spells, tearfulness.  Irritability.  Anxiety. Postpartum depression symptoms typically begin within the first month after giving birth. These symptoms include:  Difficulty sleeping or excessive sleepiness.  Marked weight loss.  Agitation.  Feelings of worthlessness.  Lack of interest in activity or food. Postpartum psychosis is a very concerning condition and can be dangerous. Fortunately, it is rare. Displaying any of the following symptoms is cause for immediate medical attention. Postpartum psychosis symptoms include:  Hallucinations and delusions.  Bizarre or disorganized behavior.  Confusion or disorientation. DIAGNOSIS  A diagnosis is made by an evaluation of your symptoms. There are no medical or lab tests that lead to a diagnosis, but there are various questionnaires that a caregiver may use to identify those with the baby blues, postpartum depression, or psychosis. Often times, a screening tool called the [place] Postnatal Depression Scale is used to diagnose depression in the postpartum period.  TREATMENT The baby blues usually goes away on its own in 1 to 2 weeks. Social support is often all that is needed. You should be encouraged to get adequate sleep and rest. Occasionally, you may be given medicines to help you sleep.  Postpartum depression requires  treatment as it can last several months or longer if it is not treated. Treatment may include individual or group therapy, medicine, or both to address any social, physiological, and psychological factors that may play a role in the depression. Regular exercise, a healthy diet, rest, and social support may also be strongly recommended.  Postpartum psychosis is more serious and needs treatment right away. Hospitalization is often needed. HOME CARE INSTRUCTIONS  Get as much rest as you can. Nap when the baby sleeps.  Exercise regularly. Some women find yoga and walking to be beneficial.  Eat a balanced and nourishing diet.  Do little things that you enjoy. Have a cup of tea, take a bubble bath, read your favorite magazine, or listen to your favorite music.  Avoid alcohol.  Ask for help with household chores, cooking, grocery shopping, or running errands as needed. Do not try to do everything.  Talk to people close to you about how you are feeling. Get support from your partner, family members, friends, or other new moms.  Try to stay positive in how you think. Think about the things you are grateful for.  Do not spend a lot of time alone.  Only take medicine as directed by your caregiver.  Keep all your postpartum appointments.  Let your caregiver know if you have any concerns. SEEK MEDICAL CARE IF: You are having a reaction or problems with your medicine. SEEK IMMEDIATE MEDICAL CARE IF:  You have suicidal feelings.  You feel you may harm the baby or someone else. Document Released: 07/29/2004 Document Revised: 01/17/2012 Document Reviewed: 08/31/2011 [facility] Patient Information 2014 [place], [place].  Discharge to: home  Follow-up Information   Follow up with [facility]. Schedule an appointment as soon as possible for a visit in 5 weeks. (Call with any questions or concerns.)    Specialty:  Obstetrics and Gynecology   Contact information:   [address] [place] [address] [phone]       Haroldine Laws 01/10/2014

## 2014-01-10 NOTE — Discharge Instructions (Signed)
Sinh N? qua m ??o, Ch?m [place] Sau kh Sinh (Vaginal Delivery, Care After) Tham kh?o t? thng tin ny trong vi tu?n t?i. Nh?ng h??ng d?n xu?t vi?n ny cung c?p cho b?n thng tin v? ch?m [place] b?n thn sau khi sinh. Chuyn gia ch?m [place] s?c kh?e c?ng c th? cung c?p cho b?n cc h??ng d?n c? th?. ?i?u tr? c?a b?n ? ???c ln k? ho?ch theo th?c hnh y khoa m?i nh?t s?n c, nh?ng v?n ?? ?i khi v?n x?y ra. Hy g?i cho chuyn gia ch?m [place] s?c kh?e c?a mnh n?u b?n c b?t k? v?n ?? ho?c cu h?i no sau khi v? nh. H??NG D?N CH?M [place] T?I NH  Ch? s? d?ng thu?c khng c?n k toa ho?c thu?c c?n k toa theo ch? d?n c?a chuyn gia ch?m [place] s?c kh?e ho?c d??c s?.  Khng u?ng r??u ??c bi?t khi b?n ?ang nui con b ho?c u?ng thu?c ?? gi?m ?au.  Khng nhai thu?c l ho?c ht thu?c.  Khng s? d?ng ma ty b?t h?p php.  Ti?p t?c ch?m [place] t?t vng ?y ch?u. Ch?m [place] t?t vng ?y ch?u bao g?m:  Lau ?y ch?u c?a b?n t? tr??c ra sau.  Gi? ?y ch?u c?a b?n s?ch s?Imagene Sheller.  Khng s? d?ng b?ng v? sinh ho?c th?t r?a m ??o cho ??n khi chuyn gia ch?m [place] s?c kh?e cho php.  T?m, g?i ??u v t?m b?n theo ch? d?n c?a chuyn gia ch?m [place] s?c kh?e.  M?c o ng?c v?a v?n h? tr? ng?c.  ?n th?c ph?m c l?i cho s?c kh?e.  U?ng ?? n??c ?? gi? cho n??c ti?u trong ho?c vng nh?t.  ?n cc lo?i th?c ph?m nhi?u ch?t x? nh? ng? c?c nguyn h?t, bnh m, g?o l?c, cc lo?i ??u, tri cy t??i v rau qu? m?i ngy. Nh?ng th?c ph?m ny c th? gip ng?n ng?a ho?c lm gi?m to bn.  Lm hi?n theo khuy?n ngh? c?a chuyn gia ch?m [place] s?c kh?e v? vi?c b?t ??u l?i cc ho?t ??ng nh? leo c?u thang, li xe, nng, t?p th? d?c ho?c ?i l?i.  Ni chuy?n v?i chuyn gia ch?m [place] s?c kh?e v? vi?c b?t ??u l?i ho?t ??ng tnh d?c. Vi?c b?t ??u l?i ho?t ??ng tnh d?c ty thu?c vo nguy c? nhi?m trng, m?c ?? lnh b?nh, c?m gic tho?i mi v mong mu?n ti?p t?c ho?t ??ng tnh d?c c?a b?n.  C? g?ng ?? c ai ? gip b?n cc cng vi?c n?i tr? v tr? m?i sinh cho t  nh?t m?t vi ngy sau khi xu?t vi?n.  Ngh? ng?i cng nhi?u cng t?t. C? g?ng ngh? ng?i ho?c c gi?c ng? ng?n khi tr? m?i sinh ?ang ng?Marland Kitchen.  T?ng ho?t ??ng d?n d?n.  Tun th? m?i cu?c h?n lin quan ngay sau khi sinh ? ???c s?p x?p l?ch. ?i?u r?t quan tr?ng l ph?i gi? m?i cu?c h?n khm l?i ? ???c s?p x?p l?ch. T?i cc cu?c h?n ny, chuyn gia ch?m  s?c kh?e s? ki?m tra ?? ??m b?o r?ng b?n ?ang h?i ph?c v? m?t th? ch?t v tnh c?m. HY ?I KHM N?U:  m ??o cho ra cc c?c mu ?ng l?n. Gi? l?i m?i c?c mu ?ng ?? chuyn gia ch?m  s?c kh?e xem.  D?ch m ??o c mi hi.  B?n g?p v?n ?? khi ti?u ti?n.  B?n ?i ti?u th??ng xuyn.  B?n b? ?au khi ?i ti?u.  C s? thay ??i trong ??  i ti?n.  B? t?y ??, ?au ho?c s?ng g?n v?t r?ch m ??o (c?t t?ng sinh mn) hay v?t rch m ??o.  C m? ch?y ra t? v?t c?t t?ng sinh mn ho?c v?t rch m ??o.  V?t c?t t?ng sinh mn ho?c v?t rch m ??o h mi?ng.  Ng?c b? ?au, c?ng ho?c ??.  B?n b? ?au ??u r?t nhi?u.  M?t b?n b? m? ho?c nhn th?y cc v?t l?m ??m.  B?n c?m th?y bu?n ho?c chn n?n.  B?n c suy ngh? v? vi?c t? lm t?n th??ng mnh ho?c tr? m?i sinh.  B?n c cu h?i v? vi?c ch?m [place] b?n thn, ch?m [place] tr? m?i sinh ho?c v? thu?c.  B?n b? chng m?t ho?c ?au ??u.  B?n b? pht ban.  B?n b? bu?n nn ho?c nn m?a.  B?n ? cho con b v ch?a c kinh nguy?t trong vng 12 tu?n sau khi ng?ng cho con b.  B?n khng cho con b v ch?a c kinh nguy?t trong kho?ng 12 tu?n sau khi sinh.  B?n b? s?t. HY NGAY L?P T?C ?I KHM N?U:  B?n b? ?au dai d?ng.  B?n b? ?au ng?c.  B?n b? kh th?.  B?n b? ng?t.  B?n b? ?au chn.  B?n b? ?au d? dy.  Ch?y mu m ??o th?m ??m hai mi?ng dn v? sinh tr? ln trong 1 ti?ng. ??M B?O B?N:  Hi?u cc h??ng d?n ny.  S? theo di tnh tr?ng c?a mnh.  S? yu c?u tr? gip ngay l?p t?c n?u b?n c?m th?y khng ?? ho?c tnh tr?ng tr?m tr?ng h?n. Document Released: 10/25/2005 Document Revised:  06/27/2013 [facility] Patient Information 2014 [place], [place].  Postpartum Depression and Baby Blues The postpartum period begins right after the birth of a baby. During this time, there is often a great amount of joy and excitement. It is also a time of considerable changes in the life of the parent(s). Regardless of how many times a mother gives birth, each child brings new challenges and dynamics to the family. It is not unusual to have feelings of excitement accompanied by confusing shifts in moods, emotions, and thoughts. All mothers are at risk of developing postpartum depression or the "baby blues." These mood changes can occur right after giving birth, or they may occur many months after giving birth. The baby blues or postpartum depression can be mild or severe. Additionally, postpartum depression can resolve rather quickly, or it can be a long-term condition. CAUSES Elevated hormones and their rapid decline are thought to be a main cause of postpartum depression and the baby blues. There are a number of hormones that radically change during and after pregnancy. Estrogen and progesterone usually decrease immediately after delivering your baby. The level of thyroid hormone and various cortisol steroids also rapidly drop. Other factors that play a major role in these changes include major life events and genetics.  RISK FACTORS If you have any of the following risks for the baby blues or postpartum depression, know what symptoms to watch out for during the postpartum period. Risk factors that may increase the likelihood of getting the baby blues or postpartum depression include:  Havinga personal or family history of depression.  Having depression while being pregnant.  Having premenstrual or oral contraceptive-associated mood issues.  Having exceptional life stress.  Having marital conflict.  Lacking a social support network.  Having a baby with special needs.  Having health problems  such as diabetes. SYMPTOMS Baby blues  symptoms include:  Brief fluctuations in mood, such as going from extreme happiness to sadness.  Decreased concentration.  Difficulty sleeping.  Crying spells, tearfulness.  Irritability.  Anxiety. Postpartum depression symptoms typically begin within the first month after giving birth. These symptoms include:  Difficulty sleeping or excessive sleepiness.  Marked weight loss.  Agitation.  Feelings of worthlessness.  Lack of interest in activity or food. Postpartum psychosis is a very concerning condition and can be dangerous. Fortunately, it is rare. Displaying any of the following symptoms is cause for immediate medical attention. Postpartum psychosis symptoms include:  Hallucinations and delusions.  Bizarre or disorganized behavior.  Confusion or disorientation. DIAGNOSIS  A diagnosis is made by an evaluation of your symptoms. There are no medical or lab tests that lead to a diagnosis, but there are various questionnaires that a caregiver may use to identify those with the baby blues, postpartum depression, or psychosis. Often times, a screening tool called the [place] Postnatal Depression Scale is used to diagnose depression in the postpartum period.  TREATMENT The baby blues usually goes away on its own in 1 to 2 weeks. Social support is often all that is needed. You should be encouraged to get adequate sleep and rest. Occasionally, you may be given medicines to help you sleep.  Postpartum depression requires treatment as it can last several months or longer if it is not treated. Treatment may include individual or group therapy, medicine, or both to address any social, physiological, and psychological factors that may play a role in the depression. Regular exercise, a healthy diet, rest, and social support may also be strongly recommended.  Postpartum psychosis is more serious and needs treatment right away. Hospitalization is often  needed. HOME CARE INSTRUCTIONS  Get as much rest as you can. Nap when the baby sleeps.  Exercise regularly. Some women find yoga and walking to be beneficial.  Eat a balanced and nourishing diet.  Do little things that you enjoy. Have a cup of tea, take a bubble bath, read your favorite magazine, or listen to your favorite music.  Avoid alcohol.  Ask for help with household chores, cooking, grocery shopping, or running errands as needed. Do not try to do everything.  Talk to people close to you about how you are feeling. Get support from your partner, family members, friends, or other new moms.  Try to stay positive in how you think. Think about the things you are grateful for.  Do not spend a lot of time alone.  Only take medicine as directed by your caregiver.  Keep all your postpartum appointments.  Let your caregiver know if you have any concerns. SEEK MEDICAL CARE IF: You are having a reaction or problems with your medicine. SEEK IMMEDIATE MEDICAL CARE IF:  You have suicidal feelings.  You feel you may harm the baby or someone else. Document Released: 07/29/2004 Document Revised: 01/17/2012 Document Reviewed: 08/06/2013 [facility] Patient Information 2014 [place], [place].  Iron-Rich Diet An iron-rich diet contains foods that are good sources of iron. Iron is an important mineral that helps your body produce hemoglobin. Hemoglobin is a protein in red blood cells that carries oxygen to the body's tissues. Sometimes, the iron level in your blood can be low. This may be caused by:  A lack of iron in your diet.  Blood loss.  Times of growth, such as during pregnancy or during a child's growth and development. Low levels of iron can cause a decrease in the number of red blood  cells. This can result in iron deficiency anemia. Iron deficiency anemia symptoms include:  Tiredness.  Weakness.  Irritability.  Increased chance of infection. Here are some recommendations for  daily iron intake:  Males older than 35 years of age need 8 mg of iron per day.  Women ages 31 to 26 need 18 mg of iron per day.  Pregnant women need 27 mg of iron per day, and women who are over 3 years of age and breastfeeding need 9 mg of iron per day.  Women over the age of 5 need 8 mg of iron per day. SOURCES OF IRON There are 2 types of iron that are found in food: heme iron and nonheme iron. Heme iron is absorbed by the body better than nonheme iron. Heme iron is found in meat, poultry, and fish. Nonheme iron is found in grains, beans, and vegetables. Heme Iron Sources Food / Iron (mg)  Chicken liver, 3 oz (85 g)/ 10 mg  Beef liver, 3 oz (85 g)/ 5.5 mg  Oysters, 3 oz (85 g)/ 8 mg  Beef, 3 oz (85 g)/ 2 to 3 mg  Shrimp, 3 oz (85 g)/ 2.8 mg  [place], 3 oz (85 g)/ 2 mg  Chicken, 3 oz (85 g) / 1 mg  Fish (tuna, halibut), 3 oz (85 g)/ 1 mg  Pork, 3 oz (85 g)/ 0.9 mg Nonheme Iron Sources Food / Iron (mg)  Ready-to-eat breakfast cereal, iron-fortified / 3.9 to 7 mg  Tofu,  cup / 3.4 mg  Kidney beans,  cup / 2.6 mg  Baked potato with skin / 2.7 mg  Asparagus,  cup / 2.2 mg  Avocado / 2 mg  Dried peaches,  cup / 1.6 mg  Raisins,  cup / 1.5 mg  Soy milk, 1 cup / 1.5 mg  Whole-wheat bread, 1 slice / 1.2 mg  Spinach, 1 cup / 0.8 mg  Broccoli,  cup / 0.6 mg IRON ABSORPTION Certain foods can decrease the body's absorption of iron. Try to avoid these foods and beverages while eating meals with iron-containing foods:  Coffee.  Tea.  Fiber.  Soy. Foods containing vitamin C can help increase the amount of iron your body absorbs from iron sources, especially from nonheme sources. Eat foods with vitamin C along with iron-containing foods to increase your iron absorption. Foods that are high in vitamin C include many fruits and vegetables. Some good sources are:  Fresh orange juice.  Oranges.  Strawberries.  [place]angoes.  Grapefruit.  Red bell  peppers.  Green bell peppers.  Broccoli.  Potatoes with skin.  Tomato juice. Document Released: 06/08/2005 Document Revised: 01/17/2012 Document Reviewed: 04/15/2011 [facility] Patient Information 2014 [place], [place].  Intrauterine Device Information An intrauterine device (IUD) is inserted into your uterus to prevent pregnancy. There are two types of IUDs available:   Copper IUD This type of IUD is wrapped in copper wire and is placed inside the uterus. Copper makes the uterus and fallopian tubes produce a fluid that kills sperm. The copper IUD can stay in place for 10 years.  Hormone IUD This type of IUD contains the hormone progestin (synthetic progesterone). The hormone thickens the cervical mucus and prevents sperm from entering the uterus. It also thins the uterine lining to prevent implantation of a fertilized egg. The hormone can weaken or kill the sperm that get into the uterus. One type of hormone IUD can stay in place for 5 years, and another type can stay in place for 3  years. Your health care provider will make sure you are a good candidate for a contraceptive IUD. Discuss with your health care provider the possible side effects.  ADVANTAGES OF AN INTRAUTERINE DEVICE  IUDs are highly effective, reversible, long acting, and low maintenance.   There are no estrogen-related side effects.   An IUD can be used when breastfeeding.   IUDs are not associated with weight gain.   The copper IUD works immediately after insertion.   The hormone IUD works right away if inserted within 7 days of your period starting. You will need to use a backup method of birth control for 7 days if the hormone IUD is inserted at any other time in your cycle.  The copper IUD does not interfere with your female hormones.   The hormone IUD can make heavy menstrual periods lighter and decrease cramping.   The hormone IUD can be used for 3 or 5 years.   The copper IUD can be used for 10  years. DISADVANTAGES OF AN INTRAUTERINE DEVICE  The hormone IUD can be associated with irregular bleeding patterns.   The copper IUD can make your menstrual flow heavier and more painful.   You may experience cramping and vaginal bleeding after insertion.  Document Released: 09/28/2004 Document Revised: 06/27/2013 Document Reviewed: 04/15/2013 [facility] Patient Information 2014 [place], [place].  Intrauterine Device Insertion Most often, an intrauterine device (IUD) is inserted into the uterus to prevent pregnancy. There are 2 types of IUDs available:  Copper IUD This type of IUD creates an environment that is not favorable to sperm survival. The mechanism of action of the copper IUD is not known for certain. It can stay in place for 10 years.  Hormone IUD This type of IUD contains the hormone progestin (synthetic progesterone). The progestin thickens the cervical mucus and prevents sperm from entering the uterus, and it also thins the uterine lining. There is no evidence that the hormone IUD prevents implantation. One hormone IUD can stay in place for up to 5 years, and a different hormone IUD can stay in place for up to 3 years. An IUD is the most cost-effective birth control if left in place for the full duration. It may be removed at any time. LET [facility] CARE PROVIDER KNOW ABOUT:  Any allergies you have.  All medicines you are taking, including vitamins, herbs, eye drops, creams, and over-the-counter medicines.  Previous problems you or members of your family have had with the use of anesthetics.  Any blood disorders you have.  Previous surgeries you have had.  Possibility of pregnancy.  Medical conditions you have. RISKS AND COMPLICATIONS  Generally, intrauterine device insertion is a safe procedure. However, as with any procedure, complications can occur. Possible complications include:  Accidental puncture (perforation) of the uterus.  Accidental placement of the  IUD either in the muscle layer of the uterus (myometrium) or outside the uterus. If this happens, the IUD can be found essentially floating around the bowels and must be taken out surgically.  The IUD may fall out of the uterus (expulsion). This is more common in women who have recently had a child.   Pregnancy in the fallopian tube (ectopic).  Pelvic inflammatory disease (PID), which is infection of the uterus and fallopian tubes. The risk of PID is slightly increased in the first 20 days after the IUD is placed, but the overall risk is still very low. BEFORE THE PROCEDURE  Schedule the IUD insertion for when you will have  your menstrual period or right after, to make sure you are not pregnant. Placement of the IUD is better tolerated shortly after a menstrual cycle.  You may need to take tests or be examined to make sure you are not pregnant.  You may be required to take a pregnancy test.  You may be required to get checked for sexually transmitted infections (STIs) prior to placement. Placing an IUD in someone who has an infection can make the infection worse.  You may be given a pain reliever to take 1 or 2 hours before the procedure.  An exam will be performed to determine the size and position of your uterus.  Ask your health care provider about changing or stopping your regular medicines. PROCEDURE   A tool (speculum) is placed in the vagina. This allows your health care provider to see the lower part of the uterus (cervix).  The cervix is prepped with a medicine that lowers the risk of infection.  You may be given a medicine to numb each side of the cervix (intracervical or paracervical block). This is used to block and control any discomfort with insertion.  A tool (uterine sound) is inserted into the uterus to determine the length of the uterine cavity and the direction the uterus may be tilted.  A slim instrument (IUD inserter) is inserted through the cervical canal and  into your uterus.  The IUD is placed in the uterine cavity and the insertion device is removed.  The nylon string that is attached to the IUD and used for eventual IUD removal is trimmed. It is trimmed so that it lays high in the vagina, just outside the cervix. AFTER THE PROCEDURE  You may have bleeding after the procedure. This is normal. It varies from light spotting for a few days to menstrual-like bleeding.  You may have mild cramping. Document Released: 06/23/2011 Document Revised: 08/15/2013 Document Reviewed: 04/15/2013 [facility] Patient Information 2014 [place].

## 2014-09-09 ENCOUNTER — Encounter (HOSPITAL_COMMUNITY): Payer: Self-pay | Admitting: Obstetrics and Gynecology

## 2023-05-03 DIAGNOSIS — Z6820 Body mass index (BMI) 20.0-20.9, adult: Secondary | ICD-10-CM | POA: Diagnosis not present

## 2023-05-03 DIAGNOSIS — Z3040 Encounter for surveillance of contraceptives, unspecified: Secondary | ICD-10-CM | POA: Diagnosis not present

## 2023-05-03 DIAGNOSIS — Z124 Encounter for screening for malignant neoplasm of cervix: Secondary | ICD-10-CM | POA: Diagnosis not present

## 2023-05-03 DIAGNOSIS — Z01419 Encounter for gynecological examination (general) (routine) without abnormal findings: Secondary | ICD-10-CM | POA: Diagnosis not present

## 2023-08-24 DIAGNOSIS — Z113 Encounter for screening for infections with a predominantly sexual mode of transmission: Secondary | ICD-10-CM | POA: Diagnosis not present

## 2024-12-26 ENCOUNTER — Encounter: Payer: Self-pay | Admitting: Obstetrics and Gynecology
